# Patient Record
Sex: Female | Born: 1980 | Race: White | Hispanic: No | Marital: Married | Smoking: Never smoker
Health system: Southern US, Community
[De-identification: ages and names within clinical notes are randomized; demographics above are authoritative.]

## PROBLEM LIST (undated history)

## (undated) ENCOUNTER — Inpatient Hospital Stay (HOSPITAL_COMMUNITY): Payer: Self-pay

## (undated) DIAGNOSIS — Z789 Other specified health status: Secondary | ICD-10-CM

## (undated) DIAGNOSIS — R87629 Unspecified abnormal cytological findings in specimens from vagina: Secondary | ICD-10-CM

## (undated) HISTORY — PX: WISDOM TOOTH EXTRACTION: SHX21

## (undated) HISTORY — PX: NO PAST SURGERIES: SHX2092

---

## 2016-10-29 NOTE — L&D Delivery Note (Signed)
Patient is a 36 y.o. now [id number] s/p NSVD at 5660w6d, who was admitted for active labor with membranes intact.    Paulino RilyLiechty, BoyA Nettye [[id number]][[id number]]  Delivery Note At 5:11 PM a viable female (BABY Boy A) was delivered via  (Presentation: direct OA;  ).  APGAR: pending  , ; weight: pending  .   Placenta status: intact , 3VC.  Cord:  with the no complications: .  Cord pH: pending.   Delivered by Dr. Parke SimmersBland with direct supervision of Dr. Emelda FearFerguson and Hart RochesterLawson, CNM  Anesthesia:  epidural Episiotomy:  no Lacerations:  No lacerations requiring repair Suture Repair: N/A Est. Blood Loss (mL): 250ml   Baby Boy A, head delivered direct OA. No nuchal cord present. Shoulder and body delivered in usual fashion. Infant with spontaneous cry, placed on mother's abdomen, dried and bulb suctioned. Cord clamped x 2 after 1-minute delay, and cut by family member. Cord blood drawn.   Mom to postpartum.  Baby boy A to Couplet care / Skin to Skin,  Marthenia RollingScott Kalyssa Anker 09/15/2017, 5:34 PM     Earnest RosierLiechty, GirlB Tykeisha [[id number]][[id number]]  Delivery Note At 5:22 PM a viable female (BABY Girl B) was delivered via  (Presentation: direct OA;  ).  APGAR: pending  , ; weight: pending  .   Placenta status: intact , 3VC.  Cord:  with the no complications: .  Cord pH: pending.  Delivered by Dr. Parke SimmersBland with direct supervision by Dr. Emelda FearFerguson  Anesthesia:  epidural Episiotomy:  no Lacerations:  No lacerations requiring repair Suture Repair: N/A Est. Blood Loss (mL): 250ml   Baby girl B, head delivered direct OA app. 11 minutes s/p birth of Baby Boy A. No nuchal cord present. Shoulder and body delivered in usual fashion. Infant with spontaneous cry, placed on mother's abdomen, dried and bulb suctioned. Cord clamped x 2 after 1-minute delay, and cut by family member. Cord blood drawn. Placenta delivered spontaneously with gentle cord traction. Fundus firm with massage and Pitocin. Perineum inspected and found to have no lacerations requiring  repair. Mom to postpartum.  Baby Girl B to Couplet care / Skin to Skin.  600 cytotek PO given to prevent uterine atony.  Marthenia RollingScott Ceirra Belli 09/15/2017, 5:34 PM

## 2017-05-07 ENCOUNTER — Other Ambulatory Visit (HOSPITAL_COMMUNITY)
Admission: RE | Admit: 2017-05-07 | Discharge: 2017-05-07 | Disposition: A | Discharge status: Home / Self Care | Payer: Medicaid Other | Source: Ambulatory Visit | Attending: Certified Nurse Midwife | Admitting: Certified Nurse Midwife

## 2017-05-07 ENCOUNTER — Encounter: Payer: Self-pay | Admitting: Certified Nurse Midwife

## 2017-05-07 ENCOUNTER — Ambulatory Visit (INDEPENDENT_AMBULATORY_CARE_PROVIDER_SITE_OTHER): Payer: Medicaid Other | Admitting: Certified Nurse Midwife

## 2017-05-07 VITALS — BP 104/67 | HR 71 | Ht 65.0 in | Wt 181.0 lb

## 2017-05-07 DIAGNOSIS — Z3492 Encounter for supervision of normal pregnancy, unspecified, second trimester: Secondary | ICD-10-CM | POA: Insufficient documentation

## 2017-05-07 DIAGNOSIS — Z3480 Encounter for supervision of other normal pregnancy, unspecified trimester: Secondary | ICD-10-CM

## 2017-05-07 DIAGNOSIS — O0932 Supervision of pregnancy with insufficient antenatal care, second trimester: Secondary | ICD-10-CM | POA: Diagnosis not present

## 2017-05-07 DIAGNOSIS — O0992 Supervision of high risk pregnancy, unspecified, second trimester: Secondary | ICD-10-CM

## 2017-05-07 DIAGNOSIS — O0990 Supervision of high risk pregnancy, unspecified, unspecified trimester: Secondary | ICD-10-CM

## 2017-05-07 DIAGNOSIS — O0930 Supervision of pregnancy with insufficient antenatal care, unspecified trimester: Secondary | ICD-10-CM | POA: Insufficient documentation

## 2017-05-07 DIAGNOSIS — O30092 Twin pregnancy, unable to determine number of placenta and number of amniotic sacs, second trimester: Secondary | ICD-10-CM

## 2017-05-07 MED ORDER — OB COMPLETE PETITE 35-5-1-200 MG PO CAPS: 1.0000 | ORAL_CAPSULE | Freq: Every day | ORAL | 12 refills | Status: AC

## 2017-05-07 NOTE — Addendum Note (Signed)
Addended by: Orvilla CornwallENNEY, Arnika Larzelere A on: 05/07/2017 04:37 PM   Modules accepted: Orders

## 2017-05-07 NOTE — Progress Notes (Signed)
Subjective:    Andrea Alexander is being seen today for her first obstetrical visit.  This is a planned pregnancy. She is at [redacted]w[redacted]d gestation. Her obstetrical history is significant for grand multigravida all vaginal deliveries at term, no problems, no chronic health issues. Relationship with FOB: spouse, living together. Patient does intend to breast feed. Pregnancy history fully reviewed.  FOB has a hx of twins. 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  Does have her grandfather who is total care living with them.    The information documented in the HPI was reviewed and verified.  Menstrual History: OB History    Gravida Para Term Preterm AB Living   5 4 4     4    SAB TAB Ectopic Multiple Live Births           4       Patient's last menstrual period was 12/24/2016.    History reviewed. No pertinent past medical history.  History reviewed. No pertinent surgical history.   (Not in a hospital admission) Not on File  Social History  Substance Use Topics  . Smoking status: Never Smoker  . Smokeless tobacco: Never Used  . Alcohol use No    History reviewed. No pertinent family history.   Review of Systems Constitutional: negative for weight loss Gastrointestinal: negative for vomiting Genitourinary:negative for genital lesions and vaginal discharge and dysuria Musculoskeletal:negative for back pain Behavioral/Psych: negative for abusive relationship, depression, illegal drug usage and tobacco use    Objective:    BP 104/67   Pulse 71   Ht 5\' 5"  (1.651 m)   Wt 181 lb (82.1 kg)   LMP 12/24/2016   BMI 30.12 kg/m  General Appearance:    Alert, cooperative, no distress, appears stated age  Head:    Normocephalic, without obvious abnormality, atraumatic  Eyes:    PERRL, conjunctiva/corneas clear, EOM's intact, fundi    benign, both eyes  Ears:    Normal TM's and external ear canals, both ears  Nose:   Nares normal, septum midline, mucosa normal, no drainage    or sinus tenderness   Throat:   Lips, mucosa, and tongue normal; teeth and gums normal  Neck:   Supple, symmetrical, trachea midline, no adenopathy;    thyroid:  no enlargement/tenderness/nodules; no carotid   bruit or JVD  Back:     Symmetric, no curvature, ROM normal, no CVA tenderness  Lungs:     Clear to auscultation bilaterally, respirations unlabored  Chest Wall:    No tenderness or deformity   Heart:    Regular rate and rhythm, S1 and S2 normal, no murmur, rub   or gallop  Breast Exam:    No tenderness, masses, or nipple abnormality  Abdomen:     Soft, non-tender, bowel sounds active all four quadrants,    no masses, no organomegaly  Genitalia:    Normal female without lesion, discharge or tenderness  Extremities:   Extremities normal, atraumatic, no cyanosis or edema  Pulses:   2+ and symmetric all extremities  Skin:   Skin color, texture, turgor normal, no rashes or lesions  Lymph nodes:   Cervical, supraclavicular, and axillary nodes normal  Neurologic:   CNII-XII intact, normal strength, sensation and reflexes    throughout        Cervix: friable on exam, long, thick, closed and posterior.  FHR: by doppler 147;  154,  FH: 22 cm.    Limited US:  Dianionic fetuses noted with heart rates and movement.  Dividing membrane noted. Probable Di/Di twins.     Lab Review Urine pregnancy test Labs reviewed yes Radiologic studies reviewed no  Assessment & Plan    Pregnancy at 3646w1d weeks    1. Supervision of other normal pregnancy, antepartum     Twin gestation noted later in visit.  Dx changed in problem list.  - Varicella zoster antibody, IgG - Culture, OB Urine - Hemoglobin A1c - Obstetric Panel, Including HIV - Cystic Fibrosis Mutation 97 - Hemoglobinopathy evaluation - TSH Pregnancy - Cytology - PAP - Cervicovaginal ancillary only - US MFM OB COMP + 14 WK; Future - Prenat-FeCbn-FeAspGl-FA-Omega (OB COMPLETE PETITE) 35-5-1-200 MG CAPS; Take 1 tablet by mouth daily.   Dispense: 30 capsule; Refill: 12  2. Late prenatal care     @19 + weeks  3. Twin preg, unable to dtrm num plcnta & amnio sacs, 2nd tri      Probable Di/Di twin pregnancy - US MFM OB DETAIL +14 WK; Future - US MFM OB DETAIL ADDL GEST +14 WK; Future  4. Supervision of high risk pregnancy, antepartum           Prenatal vitamins.  Counseling provided regarding continued use of seat belts, cessation of alcohol consumption, smoking or use of illicit drugs; infection precautions i.e., influenza/TDAP immunizations, toxoplasmosis,CMV, parvovirus, listeria and varicella; workplace safety, exercise during pregnancy; routine dental care, safe medications, sexual activity, hot tubs, saunas, pools, travel, caffeine use, fish and methlymercury, potential toxins, hair treatments, varicose veins Weight gain recommendations per IOM guidelines reviewed: underweight/BMI< 18.5--> gain 28 - 40 lbs; normal weight/BMI 18.5 - 24.9--> gain 25 - 35 lbs; overweight/BMI 25 - 29.9--> gain 15 - 25 lbs; obese/BMI >30->gain  11 - 20 lbs Problem list reviewed and updated. FIRST/CF mutation testing/NIPT/QUAD SCREEN/fragile X/Ashkenazi Jewish population testing/Spinal muscular atrophy discussed: requested. Role of ultrasound in pregnancy discussed; fetal survey: ordered. Amniocentesis discussed: not indicated.  Meds ordered this encounter  Medications  . Prenat-FeCbn-FeAspGl-FA-Omega (OB COMPLETE PETITE) 35-5-1-200 MG CAPS    Sig: Take 1 tablet by mouth daily.    Dispense:  30 capsule    Refill:  12   Orders Placed This Encounter  Procedures  . Culture, OB Urine  . US MFM OB COMP + 14 WK    Standing Status:   Future    Standing Expiration Date:   07/08/2018    Order Specific Question:   Reason for Exam (SYMPTOM  OR DIAGNOSIS REQUIRED)    Answer:   fetal anatomy scan    Order Specific Question:   Preferred imaging location?    Answer:   [facility]  . US MFM OB DETAIL +14 WK    Standing Status:   Future     Standing Expiration Date:   07/08/2018    Order Specific Question:   Reason for Exam (SYMPTOM  OR DIAGNOSIS REQUIRED)    Answer:   twin pregnancy    Order Specific Question:   Preferred imaging location?    Answer:   [facility]  . US MFM OB DETAIL ADDL GEST +14 WK    Standing Status:   Future    Standing Expiration Date:   07/08/2018    Order Specific Question:   Reason for Exam (SYMPTOM  OR DIAGNOSIS REQUIRED)    Answer:   fetal anatomy scan, twin pregnancy, late to care    Order Specific Question:   Preferred  imaging location?    Answer:   [facility]  . Varicella zoster antibody, IgG  . Hemoglobin A1c  . Obstetric Panel, Including HIV  . Cystic Fibrosis Mutation 97  . Hemoglobinopathy evaluation  . TSH Pregnancy    Follow up in 4 weeks. 50% of 45 min visit spent on counseling and coordination of care.

## 2017-05-09 LAB — CERVICOVAGINAL ANCILLARY ONLY
Bacterial vaginitis: NEGATIVE
CANDIDA VAGINITIS: NEGATIVE
CHLAMYDIA, DNA PROBE: NEGATIVE
NEISSERIA GONORRHEA: NEGATIVE
Trichomonas: NEGATIVE

## 2017-05-10 ENCOUNTER — Other Ambulatory Visit: Payer: Self-pay | Admitting: Certified Nurse Midwife

## 2017-05-10 DIAGNOSIS — O0990 Supervision of high risk pregnancy, unspecified, unspecified trimester: Secondary | ICD-10-CM

## 2017-05-10 LAB — OBSTETRIC PANEL, INCLUDING HIV
Antibody Screen: NEGATIVE
BASOS ABS: 0 10*3/uL (ref 0.0–0.2)
Basos: 0 %
EOS (ABSOLUTE): 0.1 10*3/uL (ref 0.0–0.4)
Eos: 1 %
HIV SCREEN 4TH GENERATION: NONREACTIVE
Hematocrit: 36.4 % (ref 34.0–46.6)
Hemoglobin: 12.3 g/dL (ref 11.1–15.9)
Hepatitis B Surface Ag: NEGATIVE
IMMATURE GRANULOCYTES: 1 %
Immature Grans (Abs): 0.1 10*3/uL (ref 0.0–0.1)
LYMPHS ABS: 2 10*3/uL (ref 0.7–3.1)
Lymphs: 17 %
MCH: 30.6 pg (ref 26.6–33.0)
MCHC: 33.8 g/dL (ref 31.5–35.7)
MCV: 91 fL (ref 79–97)
MONOS ABS: 0.9 10*3/uL (ref 0.1–0.9)
Monocytes: 7 %
NEUTROS ABS: 8.6 10*3/uL — AB (ref 1.4–7.0)
NEUTROS PCT: 74 %
PLATELETS: 162 10*3/uL (ref 150–379)
RBC: 4.02 x10E6/uL (ref 3.77–5.28)
RDW: 13.5 % (ref 12.3–15.4)
RPR: NONREACTIVE
Rh Factor: POSITIVE
Rubella Antibodies, IGG: 1.37 index (ref >0.99)
WBC: 11.6 10*3/uL — AB (ref 3.4–10.8)

## 2017-05-10 LAB — CULTURE, OB URINE

## 2017-05-10 LAB — TSH PREGNANCY: TSH Pregnancy: 3.03 u[IU]/mL (ref 0.450–4.500)

## 2017-05-10 LAB — HEMOGLOBINOPATHY EVALUATION
HGB A: 97.5 % (ref 96.4–98.8)
HGB C: 0 %
HGB S: 0 %
HGB VARIANT: 0 %
Hemoglobin A2 Quantitation: 2.5 % (ref 1.8–3.2)
Hemoglobin F Quantitation: 0 % (ref 0.0–2.0)

## 2017-05-10 LAB — HEMOGLOBIN A1C
Est. average glucose Bld gHb Est-mCnc: 94 mg/dL
Hgb A1c MFr Bld: 4.9 % (ref 4.8–5.6)

## 2017-05-10 LAB — VARICELLA ZOSTER ANTIBODY, IGG: VARICELLA: 1523 {index} (ref >165)

## 2017-05-10 LAB — URINE CULTURE, OB REFLEX

## 2017-05-13 ENCOUNTER — Other Ambulatory Visit (HOSPITAL_COMMUNITY): Payer: Medicaid Other

## 2017-05-13 LAB — CYTOLOGY - PAP
Diagnosis: UNDETERMINED — AB
HPV (WINDOPATH): NOT DETECTED

## 2017-05-15 ENCOUNTER — Other Ambulatory Visit: Payer: Self-pay | Admitting: Certified Nurse Midwife

## 2017-05-15 DIAGNOSIS — O2342 Unspecified infection of urinary tract in pregnancy, second trimester: Secondary | ICD-10-CM | POA: Insufficient documentation

## 2017-05-15 DIAGNOSIS — O0990 Supervision of high risk pregnancy, unspecified, unspecified trimester: Secondary | ICD-10-CM

## 2017-05-15 MED ORDER — NITROFURANTOIN MONOHYD MACRO 100 MG PO CAPS: 100.0000 mg | ORAL_CAPSULE | Freq: Two times a day (BID) | ORAL | 0 refills | Status: DC

## 2017-05-16 ENCOUNTER — Encounter (HOSPITAL_COMMUNITY): Payer: Self-pay

## 2017-05-16 ENCOUNTER — Ambulatory Visit (HOSPITAL_COMMUNITY)
Admission: RE | Admit: 2017-05-16 | Discharge: 2017-05-16 | Disposition: A | Discharge status: Home / Self Care | Payer: Medicaid Other | Source: Ambulatory Visit | Attending: Certified Nurse Midwife | Admitting: Certified Nurse Midwife

## 2017-05-16 DIAGNOSIS — Z363 Encounter for antenatal screening for malformations: Secondary | ICD-10-CM | POA: Diagnosis not present

## 2017-05-16 DIAGNOSIS — O0990 Supervision of high risk pregnancy, unspecified, unspecified trimester: Secondary | ICD-10-CM

## 2017-05-16 DIAGNOSIS — O30042 Twin pregnancy, dichorionic/diamniotic, second trimester: Secondary | ICD-10-CM | POA: Insufficient documentation

## 2017-05-16 DIAGNOSIS — O30092 Twin pregnancy, unable to determine number of placenta and number of amniotic sacs, second trimester: Secondary | ICD-10-CM

## 2017-05-16 DIAGNOSIS — Z3A20 20 weeks gestation of pregnancy: Secondary | ICD-10-CM | POA: Diagnosis not present

## 2017-05-16 HISTORY — DX: Other specified health status: Z78.9

## 2017-05-17 ENCOUNTER — Other Ambulatory Visit (HOSPITAL_COMMUNITY): Payer: Medicaid Other

## 2017-05-17 ENCOUNTER — Other Ambulatory Visit (HOSPITAL_COMMUNITY): Payer: Self-pay | Admitting: *Deleted

## 2017-05-17 DIAGNOSIS — O30042 Twin pregnancy, dichorionic/diamniotic, second trimester: Secondary | ICD-10-CM

## 2017-05-20 ENCOUNTER — Other Ambulatory Visit: Payer: Self-pay | Admitting: Certified Nurse Midwife

## 2017-05-20 DIAGNOSIS — O30042 Twin pregnancy, dichorionic/diamniotic, second trimester: Secondary | ICD-10-CM

## 2017-05-20 DIAGNOSIS — O0990 Supervision of high risk pregnancy, unspecified, unspecified trimester: Secondary | ICD-10-CM

## 2017-05-20 DIAGNOSIS — O30049 Twin pregnancy, dichorionic/diamniotic, unspecified trimester: Secondary | ICD-10-CM | POA: Insufficient documentation

## 2017-05-21 ENCOUNTER — Other Ambulatory Visit: Payer: Self-pay | Admitting: Certified Nurse Midwife

## 2017-05-21 DIAGNOSIS — O0990 Supervision of high risk pregnancy, unspecified, unspecified trimester: Secondary | ICD-10-CM

## 2017-05-21 LAB — CYSTIC FIBROSIS MUTATION 97: GENE DIS ANAL CARRIER INTERP BLD/T-IMP: NOT DETECTED

## 2017-05-22 LAB — MATERNIT21 PLUS CORE+SCA

## 2017-06-04 ENCOUNTER — Ambulatory Visit (INDEPENDENT_AMBULATORY_CARE_PROVIDER_SITE_OTHER): Payer: Medicaid Other | Admitting: Certified Nurse Midwife

## 2017-06-04 ENCOUNTER — Encounter: Payer: Self-pay | Admitting: Certified Nurse Midwife

## 2017-06-04 VITALS — BP 116/71 | HR 84 | Wt 190.2 lb

## 2017-06-04 DIAGNOSIS — O30042 Twin pregnancy, dichorionic/diamniotic, second trimester: Secondary | ICD-10-CM

## 2017-06-04 DIAGNOSIS — O0990 Supervision of high risk pregnancy, unspecified, unspecified trimester: Secondary | ICD-10-CM

## 2017-06-04 DIAGNOSIS — O0932 Supervision of pregnancy with insufficient antenatal care, second trimester: Secondary | ICD-10-CM

## 2017-06-04 DIAGNOSIS — O2342 Unspecified infection of urinary tract in pregnancy, second trimester: Secondary | ICD-10-CM

## 2017-06-04 DIAGNOSIS — O0992 Supervision of high risk pregnancy, unspecified, second trimester: Secondary | ICD-10-CM

## 2017-06-04 DIAGNOSIS — O0930 Supervision of pregnancy with insufficient antenatal care, unspecified trimester: Secondary | ICD-10-CM

## 2017-06-04 MED ORDER — COMFORT FIT MATERNITY SUPP LG MISC: 1.0000 [IU] | Freq: Every day | 0 refills | Status: DC

## 2017-06-04 NOTE — Progress Notes (Signed)
   PRENATAL VISIT NOTE  Subjective:  Andrea Alexander is a 36 y.o. [id number] at 649w1d being seen today for ongoing prenatal care.  She is currently monitored for the following issues for this high-risk pregnancy and has Supervision of high risk pregnancy, antepartum; Late prenatal care; Urinary tract infection in mother during second trimester of pregnancy; and Twin gestation, dichorionic diamniotic on her problem list.  Patient reports backache, no bleeding, no contractions, no cramping and no leaking.  Contractions: Not present. Vag. Bleeding: None.  Movement: Present. Denies leaking of fluid.   The following portions of the patient's history were reviewed and updated as appropriate: allergies, current medications, past family history, past medical history, past social history, past surgical history and problem list. Problem list updated.  Objective:   Vitals:   06/04/17 1532  BP: 116/71  Pulse: 84  Weight: 190 lb 3.2 oz (86.3 kg)    Fetal Status: Fetal Heart Rate (bpm): 140;154 Fundal Height: 26 cm Movement: Present     General:  Alert, oriented and cooperative. Patient is in no acute distress.  Skin: Skin is warm and dry. No rash noted.   Cardiovascular: Normal heart rate noted  Respiratory: Normal respiratory effort, no problems with respiration noted  Abdomen: Soft, gravid, appropriate for gestational age.  Pain/Pressure: Present     Pelvic: Cervical exam deferred        Extremities: Normal range of motion.  Edema: None  Mental Status:  Normal mood and affect. Normal behavior. Normal judgment and thought content.   Assessment and Plan:  Pregnancy: G5P4004 at [redacted]w[redacted]d  1. Dichorionic diamniotic twin pregnancy in second trimester       - Elastic Bandages & Supports (COMFORT FIT MATERNITY SUPP LG) MISC; 1 Units by Does not apply route daily.  Dispense: 1 each; Refill: 0  2. Supervision of high risk pregnancy, antepartum      F/U anatomy US scheduled for 06/14/17  3. Late prenatal  care     @20  weeks  4. Urinary tract infection in mother during second trimester of pregnancy     TOC today.  - Urine Culture  Preterm labor symptoms and general obstetric precautions including but not limited to vaginal bleeding, contractions, leaking of fluid and fetal movement were reviewed in detail with the patient. Please refer to After Visit Summary for other counseling recommendations.  Return in about 4 weeks (around 07/02/2017) for 2 hr OGTT, HOB.   Roe Coombsachelle A Hassan Blackshire, CNM

## 2017-06-04 NOTE — Progress Notes (Signed)
Patient does feel pressure on/off. Patient does have some lower back pain.

## 2017-06-06 LAB — URINE CULTURE

## 2017-06-09 ENCOUNTER — Encounter (HOSPITAL_COMMUNITY): Payer: Self-pay

## 2017-06-09 ENCOUNTER — Inpatient Hospital Stay (HOSPITAL_COMMUNITY)
Admission: AD | Admit: 2017-06-09 | Discharge: 2017-06-09 | Disposition: A | Discharge status: Home / Self Care | Payer: Medicaid Other | Source: Ambulatory Visit | Attending: Obstetrics & Gynecology | Admitting: Obstetrics & Gynecology

## 2017-06-09 DIAGNOSIS — Z3A23 23 weeks gestation of pregnancy: Secondary | ICD-10-CM | POA: Diagnosis not present

## 2017-06-09 DIAGNOSIS — O30042 Twin pregnancy, dichorionic/diamniotic, second trimester: Secondary | ICD-10-CM | POA: Insufficient documentation

## 2017-06-09 DIAGNOSIS — Z3A25 25 weeks gestation of pregnancy: Secondary | ICD-10-CM | POA: Diagnosis not present

## 2017-06-09 DIAGNOSIS — O26852 Spotting complicating pregnancy, second trimester: Secondary | ICD-10-CM | POA: Diagnosis not present

## 2017-06-09 DIAGNOSIS — N898 Other specified noninflammatory disorders of vagina: Secondary | ICD-10-CM | POA: Insufficient documentation

## 2017-06-09 DIAGNOSIS — O0990 Supervision of high risk pregnancy, unspecified, unspecified trimester: Secondary | ICD-10-CM

## 2017-06-09 DIAGNOSIS — O26892 Other specified pregnancy related conditions, second trimester: Secondary | ICD-10-CM | POA: Insufficient documentation

## 2017-06-09 DIAGNOSIS — O4702 False labor before 37 completed weeks of gestation, second trimester: Secondary | ICD-10-CM | POA: Insufficient documentation

## 2017-06-09 DIAGNOSIS — O479 False labor, unspecified: Secondary | ICD-10-CM

## 2017-06-09 LAB — URINALYSIS, DIPSTICK ONLY
Bilirubin Urine: NEGATIVE
Glucose, UA: NEGATIVE mg/dL
Hgb urine dipstick: NEGATIVE
Ketones, ur: NEGATIVE mg/dL
NITRITE: NEGATIVE
Protein, ur: NEGATIVE mg/dL
SPECIFIC GRAVITY, URINE: 1.015 (ref 1.005–1.030)
pH: 6 (ref 5.0–8.0)

## 2017-06-09 LAB — WET PREP, GENITAL
Clue Cells Wet Prep HPF POC: NONE SEEN
Sperm: NONE SEEN
Trich, Wet Prep: NONE SEEN
Yeast Wet Prep HPF POC: NONE SEEN

## 2017-06-09 NOTE — Discharge Instructions (Signed)

## 2017-06-09 NOTE — MAU Provider Note (Signed)
History     CSN: [id number]  Arrival date and time: 06/09/17 2000   First Provider Initiated Contact with Patient 06/09/17 2052      Chief Complaint  Patient presents with  . Contractions  . Vaginal Bleeding   HPI  Ms. Andrea Alexander is a 36 y.o. [id number] at [redacted]w[redacted]d with DI/DI twins who presents to MAU today with complaint of spotting since last night and contractions today. She states she had mild cramping this morning and then noted 4 contractions, also mild, throughout the day today. She has noted spotting with wiping only since last night. She denies LOF. She denies recent intercourse or exam. She denies UTI symptoms. She has not yet filled her Rx for abdominal binder.   OB History    Gravida Para Term Preterm AB Living   5 4 4     4    SAB TAB Ectopic Multiple Live Births           4      Past Medical History:  Diagnosis Date  . Medical history non-contributory     Past Surgical History:  Procedure Laterality Date  . NO PAST SURGERIES      No family history on file.  Social History  Substance Use Topics  . Smoking status: Never Smoker  . Smokeless tobacco: Never Used  . Alcohol use No    Allergies: No Known Allergies  No prescriptions prior to admission.    Review of Systems  Constitutional: Negative for fever.  Gastrointestinal: Positive for abdominal pain. Negative for constipation, diarrhea, nausea and vomiting.  Genitourinary: Positive for vaginal bleeding. Negative for dysuria, frequency, urgency and vaginal discharge.   Physical Exam   Blood pressure 123/75, pulse 72, temperature 98.6 F (37 C), temperature source Oral, resp. rate 15, height 5\' 5"  (1.651 m), weight 192 lb 4 oz (87.2 kg), last menstrual period 12/24/2016.  Physical Exam  Nursing note and vitals reviewed. Constitutional: She is oriented to person, place, and time. She appears well-developed and well-nourished. No distress.  HENT:  Head: Normocephalic and atraumatic.   Cardiovascular: Normal rate.   Respiratory: Effort normal.  GI: Soft. She exhibits no distension. There is no tenderness.  Genitourinary: Uterus is enlarged. Uterus is not tender. Cervix exhibits no motion tenderness and no discharge. No bleeding in the vagina. Vaginal discharge (moderate, thin, white discharge noted) found.  Neurological: She is alert and oriented to person, place, and time.  Skin: Skin is warm and dry. No erythema.  Psychiatric: She has a normal mood and affect.  Dilation: Closed Effacement (%): Thick Cervical Position: Posterior Station: Ballotable Exam by:: Vonzella Nipple, PA-C   Results for orders placed or performed during the hospital encounter of 06/09/17 (from the past 24 hour(s))  Wet prep, genital     Status: Abnormal   Collection Time: 06/09/17  9:30 PM  Result Value Ref Range   Yeast Wet Prep HPF POC NONE SEEN NONE SEEN   Trich, Wet Prep NONE SEEN NONE SEEN   Clue Cells Wet Prep HPF POC NONE SEEN NONE SEEN   WBC, Wet Prep HPF POC MANY (A) NONE SEEN   Sperm NONE SEEN   Urinalysis, dipstick only     Status: Abnormal   Collection Time: 06/09/17 10:15 PM  Result Value Ref Range   Color, Urine YELLOW YELLOW   APPearance CLEAR CLEAR   Specific Gravity, Urine 1.015 1.005 - 1.030   pH 6.0 5.0 - 8.0   Glucose, UA NEGATIVE NEGATIVE mg/dL  Hgb urine dipstick NEGATIVE NEGATIVE   Bilirubin Urine NEGATIVE NEGATIVE   Ketones, ur NEGATIVE NEGATIVE mg/dL   Protein, ur NEGATIVE NEGATIVE mg/dL   Nitrite NEGATIVE NEGATIVE   Leukocytes, UA SMALL (A) NEGATIVE    Fetal Monitoring: Baseline A: 140 bpm Variability: moderate Accelerations: 10 x 10 Decelerations: none Baseline B: 150 bpm Variability: modereate Accelerations: 10 x 10 Decelerations: none  Contractions: none  MAU Course  Procedures None  MDM UA and wet prep today   Assessment and Plan  A: [name] Di twins at 5950w6d Braxton hicks contractions Vaginal discharge in pregnancy, second  trimester Spotting in pregnancy, second trimester   P: Discharge home Preterm labor precautions discussed Patient advised to follow-up with [facility] as planned for routine prenatal care or sooner if symptoms worsen Patient may return to MAU as needed or if her condition were to change or worsen   Vonzella NippleJulie Kalab Camps, PA-C 06/09/2017, 11:33 PM

## 2017-06-09 NOTE — Progress Notes (Addendum)
G5P4 @ 23.[redacted] wksga with DiDI twin IUP. Presents to triage for scant bleeding since yesterday. +FM felt. EFM applied.   2100: Provider at bs assessing pt.   2125: provider at bs for wetprep and SVE with speculum exam. No active bleeding noted.   2215: [facility]

## 2017-06-09 NOTE — MAU Note (Signed)
Patient presents to MAU with c/o spotting when she wiped x4, one last night then two times this morning and the last time right before she came in. States cramping and tightness has been on and off all day today. Denies recent intercourse. Denies LOF. +FM with twin B, complaints of decreased movement twin A.

## 2017-06-09 NOTE — MAU Note (Signed)
Patient did not have enough urine to send to lab but order was collected. Patient was informed to try again.

## 2017-06-14 ENCOUNTER — Encounter (HOSPITAL_COMMUNITY): Payer: Self-pay

## 2017-06-14 ENCOUNTER — Other Ambulatory Visit (HOSPITAL_COMMUNITY): Payer: Self-pay | Admitting: Maternal and Fetal Medicine

## 2017-06-14 ENCOUNTER — Ambulatory Visit (HOSPITAL_COMMUNITY)
Admission: RE | Admit: 2017-06-14 | Discharge: 2017-06-14 | Disposition: A | Discharge status: Home / Self Care | Payer: Medicaid Other | Source: Ambulatory Visit | Attending: Certified Nurse Midwife | Admitting: Certified Nurse Midwife

## 2017-06-14 DIAGNOSIS — O30042 Twin pregnancy, dichorionic/diamniotic, second trimester: Secondary | ICD-10-CM

## 2017-06-14 DIAGNOSIS — Z362 Encounter for other antenatal screening follow-up: Secondary | ICD-10-CM

## 2017-06-14 DIAGNOSIS — Z3A24 24 weeks gestation of pregnancy: Secondary | ICD-10-CM

## 2017-06-14 DIAGNOSIS — O321XX2 Maternal care for breech presentation, fetus 2: Secondary | ICD-10-CM | POA: Diagnosis not present

## 2017-06-17 ENCOUNTER — Other Ambulatory Visit (HOSPITAL_COMMUNITY): Payer: Self-pay | Admitting: *Deleted

## 2017-06-17 DIAGNOSIS — O30049 Twin pregnancy, dichorionic/diamniotic, unspecified trimester: Secondary | ICD-10-CM

## 2017-07-02 ENCOUNTER — Ambulatory Visit (INDEPENDENT_AMBULATORY_CARE_PROVIDER_SITE_OTHER): Payer: Medicaid Other | Admitting: Certified Nurse Midwife

## 2017-07-02 ENCOUNTER — Other Ambulatory Visit: Payer: Medicaid Other

## 2017-07-02 VITALS — BP 99/65 | HR 82 | Wt 197.5 lb

## 2017-07-02 DIAGNOSIS — O30042 Twin pregnancy, dichorionic/diamniotic, second trimester: Secondary | ICD-10-CM

## 2017-07-02 DIAGNOSIS — O0990 Supervision of high risk pregnancy, unspecified, unspecified trimester: Secondary | ICD-10-CM

## 2017-07-02 DIAGNOSIS — O0932 Supervision of pregnancy with insufficient antenatal care, second trimester: Secondary | ICD-10-CM

## 2017-07-02 DIAGNOSIS — O2242 Hemorrhoids in pregnancy, second trimester: Secondary | ICD-10-CM

## 2017-07-02 DIAGNOSIS — O0930 Supervision of pregnancy with insufficient antenatal care, unspecified trimester: Secondary | ICD-10-CM

## 2017-07-02 DIAGNOSIS — O0992 Supervision of high risk pregnancy, unspecified, second trimester: Secondary | ICD-10-CM

## 2017-07-02 MED ORDER — HYDROCORTISONE ACETATE 25 MG RE SUPP: 25.0000 mg | Freq: Two times a day (BID) | RECTAL | 4 refills | Status: DC

## 2017-07-02 NOTE — Patient Instructions (Addendum)
Places to have your son circumcised:    [facility] [phone] $480 by 4 wks  [facility] [phone] $244 by 4 wks  [facility] [phone] $175 by 2 wks  [facility] [phone] $220 by 4 wks [facility] [phone] $150 by 4 wks  These prices sometimes change but are roughly what you can expect to pay. Please call and confirm pricing.   Circumcision is considered an elective/non-medically necessary procedure. There are many reasons parents decide to have their sons circumsized. During the first year of life circumcised males have a reduced risk of urinary tract infections but after this year the rates between circumcised males and uncircumcised males are the same.  It is safe to have your son circumcised outside of the hospital and the places above perform them regularly.   AREA PEDIATRIC/FAMILY PRACTICE PHYSICIANS  [facility] [address] [place], [place]  [address] Phone - [phone]   Fax - [phone]  [facility] [address] [place], [place] [address] Phone - [phone]   Fax - [phone]  JACK AMOS [address] [place], [place]  [address] Phone - [phone]   Fax - [phone]  [facility] [address], [address] [place], [place]  [address] Phone - [phone]   Fax - [phone]  [facility] [address] [place], [place]  [address] Phone - [phone]   Fax - [phone]  [facility] [address], [address] [place], [place]  [address] Phone - [phone]   Fax - [phone]  [facility] [address] [place], [place]  [address] Phone - [phone]   Fax - [phone]  [facility] [address], [address] [place], [place]  [address] Phone - [phone]   Fax - [phone]  [facility] [address] [place], [place]  [address] Phone - [phone]   Fax - [phone] [facility] [address] [place], [place]  [address] Phone - [phone]   Fax - [phone]  [facility] [address] [place], [place]  [address] Phone - [phone]   Fax - [phone]  [facility] [address], [address] [place], [place]  [address] Phone - [phone]   Fax - [phone]  [facility] [address] [place], [place]  [address] Phone - [phone]   Fax - [phone]  [facility] [address], [address] [place], [place]  [address] Phone - [phone]  [facility] [address] [place], [place]  [address] Phone - [phone]   Fax - [phone]  [facility] [address], [address] [place], [place]  [address] Phone - [phone]   Fax - [phone]  [facility] [address] [place], [place]  [address] Phone - [phone]   Fax - [phone]  [facility] [address] [place], [place]  [address] Phone - [phone]   Fax - [phone]   [facility] [address] [place], [place]  [address] Phone - [phone]   Fax - [phone]  [facility] [address] [place], [place]  [address] Phone - [phone]   Fax - [phone]  DAVID RUBIN [address] [place], [place]  [address] Phone - [phone]   Fax - [phone]  [facility] [address] [place], [place]  [address] Phone - [phone]   Fax - [phone]  [facility] Alita Chyle [address] [place], [place]  [address] Phone - [phone]   Fax - [phone] Gerarda Fraction [address] [place], [place]  [address] Phone - [phone]   Fax - [phone]  [facility] [address] [place], [place]  [address] Phone - [phone]   Fax - [phone]  [facility] [place] [address] [place], [place]  [address] Phone - [phone]   Fax - [phone]  [facility] Wyvonne Lenz MD [address] [place] [address] Phone [phone]  Fax [phone]   Contraception Choices Contraception (birth control) is the use of any methods or devices to prevent pregnancy. Below are some methods to help avoid pregnancy. Hormonal methods  Contraceptive implant. This is a thin, plastic tube containing progesterone hormone. It does not contain estrogen hormone. Your health care provider inserts the tube in the inner part of the upper arm. The tube can remain in place for up to 3 years. After 3 years, the implant must be removed. The implant prevents the ovaries from releasing an egg (ovulation), thickens the cervical mucus to prevent sperm from entering the uterus, and thins the lining of the inside of the uterus.  Progesterone-only injections. These injections are given every 3 months by your health care provider to prevent pregnancy. This synthetic progesterone hormone stops the ovaries from releasing eggs. It also thickens cervical mucus and changes the uterine lining. This makes it harder for sperm to survive in the uterus.  Birth control pills. These pills contain estrogen and progesterone hormone. They work by preventing the ovaries from releasing eggs (ovulation). They also cause the cervical mucus to thicken, preventing the sperm from entering the uterus. Birth control pills are prescribed by a health care provider.Birth control pills can also be used to treat heavy periods.  Minipill. This type of birth control pill contains only the progesterone hormone. They are taken every day of  each month and must be prescribed by your health care provider.  Birth control patch. The patch contains hormones similar to those in birth control pills. It must be changed once a week and is prescribed by a health care provider.  Vaginal ring. The ring contains hormones similar to those in birth control pills. It is left in the vagina for 3 weeks, removed for 1 week, and then a new one is put back in place. The patient must be comfortable inserting and removing the ring from the vagina.A health care provider's prescription is necessary.  Emergency contraception. Emergency contraceptives prevent pregnancy after unprotected sexual intercourse. This pill can be taken right after sex or up to 5 days after unprotected sex. It is most effective the sooner you take the pills after having sexual intercourse. Most emergency contraceptive pills are available without a prescription. Check with your pharmacist. Do not use emergency contraception as your only form of birth control. Barrier methods  Female condom. This is a thin sheath (latex or rubber) that is worn over the penis during sexual intercourse. It can be used with spermicide to increase effectiveness.  Female condom. This is a soft, loose-fitting sheath that is put into the vagina before sexual intercourse.  Diaphragm. This is a soft, latex, dome-shaped barrier that must be fitted by a health care provider. It is inserted into the vagina, along with a spermicidal jelly. It is  inserted before intercourse. The diaphragm should be left in the vagina for 6 to 8 hours after intercourse.  Cervical cap. This is a round, soft, latex or plastic cup that fits over the cervix and must be fitted by a health care provider. The cap can be left in place for up to 48 hours after intercourse.  Sponge. This is a soft, circular piece of polyurethane foam. The sponge has spermicide in it. It is inserted into the vagina after wetting it and before sexual  intercourse.  Spermicides. These are chemicals that kill or block sperm from entering the cervix and uterus. They come in the form of creams, jellies, suppositories, foam, or tablets. They do not require a prescription. They are inserted into the vagina with an applicator before having sexual intercourse. The process must be repeated every time you have sexual intercourse. Intrauterine contraception  Intrauterine device (IUD). This is a T-shaped device that is put in a woman's uterus during a menstrual period to prevent pregnancy. There are 2 types: ? Copper IUD. This type of IUD is wrapped in copper wire and is placed inside the uterus. Copper makes the uterus and fallopian tubes produce a fluid that kills sperm. It can stay in place for 10 years. ? Hormone IUD. This type of IUD contains the hormone progestin (synthetic progesterone). The hormone thickens the cervical mucus and prevents sperm from entering the uterus, and it also thins the uterine lining to prevent implantation of a fertilized egg. The hormone can weaken or kill the sperm that get into the uterus. It can stay in place for 3-5 years, depending on which type of IUD is used. Permanent methods of contraception  Female tubal ligation. This is when the woman's fallopian tubes are surgically sealed, tied, or blocked to prevent the egg from traveling to the uterus.  Hysteroscopic sterilization. This involves placing a small coil or insert into each fallopian tube. Your doctor uses a technique called hysteroscopy to do the procedure. The device causes scar tissue to form. This results in permanent blockage of the fallopian tubes, so the sperm cannot fertilize the egg. It takes about 3 months after the procedure for the tubes to become blocked. You must use another form of birth control for these 3 months.  Female sterilization. This is when the female has the tubes that carry sperm tied off (vasectomy).This blocks sperm from entering the vagina  during sexual intercourse. After the procedure, the man can still ejaculate fluid (semen). Natural planning methods  Natural family planning. This is not having sexual intercourse or using a barrier method (condom, diaphragm, cervical cap) on days the woman could become pregnant.  Calendar method. This is keeping track of the length of each menstrual cycle and identifying when you are fertile.  Ovulation method. This is avoiding sexual intercourse during ovulation.  Symptothermal method. This is avoiding sexual intercourse during ovulation, using a thermometer and ovulation symptoms.  Post-ovulation method. This is timing sexual intercourse after you have ovulated. Regardless of which type or method of contraception you choose, it is important that you use condoms to protect against the transmission of sexually transmitted infections (STIs). Talk with your health care provider about which form of contraception is most appropriate for you. This information is not intended to replace advice given to you by your health care provider. Make sure you discuss any questions you have with your health care provider. Document Released: 10/15/2005 Document Revised: 03/22/2016 Document Reviewed: 04/09/2013 Elsevier Interactive Patient Education  2017  [employer].

## 2017-07-02 NOTE — Progress Notes (Signed)
Pt states she has had some spotting, thinks it may be rectal.

## 2017-07-02 NOTE — Progress Notes (Signed)
   PRENATAL VISIT NOTE  Subjective:  Andrea Alexander is a 36 y.o. [id number] at 9075w1d being seen today for ongoing prenatal care.  She is currently monitored for the following issues for this high-risk pregnancy and has Supervision of high risk pregnancy, antepartum; Late prenatal care; Urinary tract infection in mother during second trimester of pregnancy; and Twin gestation, dichorionic diamniotic on her problem list.  Patient reports no complaints.  Contractions: Not present. Vag. Bleeding: None.  Movement: Present. Denies leaking of fluid.   The following portions of the patient's history were reviewed and updated as appropriate: allergies, current medications, past family history, past medical history, past social history, past surgical history and problem list. Problem list updated.  Objective:   Vitals:   07/02/17 1010  BP: 99/65  Pulse: 82  Weight: 197 lb 8 oz (89.6 kg)    Fetal Status: Fetal Heart Rate (bpm): 150; 140   Movement: Present     General:  Alert, oriented and cooperative. Patient is in no acute distress.  Skin: Skin is warm and dry. No rash noted.   Cardiovascular: Normal heart rate noted  Respiratory: Normal respiratory effort, no problems with respiration noted  Abdomen: Soft, gravid, appropriate for gestational age.  Pain/Pressure: Present     Pelvic: Cervical exam deferred        Extremities: Normal range of motion.     Mental Status:  Normal mood and affect. Normal behavior. Normal judgment and thought content.   Assessment and Plan:  Pregnancy: G5P4004 at 3675w1d  1. Supervision of high risk pregnancy, antepartum     Antenatal testing @35  weeks.  Delivery @38  weeks planning. Reviewed MAU note from 06/09/17.  2. Dichorionic diamniotic twin pregnancy in second trimester     F/U US scheduled for 07/12/17.  On last US: discordant <4%.   3. Late prenatal care      @20  weeks.   Preterm labor symptoms and general obstetric precautions including but not limited to  vaginal bleeding, contractions, leaking of fluid and fetal movement were reviewed in detail with the patient. Please refer to After Visit Summary for other counseling recommendations.  Return in about 2 weeks (around 07/16/2017) [facility] Needs to see FP MD here.   Roe Coombsachelle A Posie Lillibridge, CNM

## 2017-07-03 LAB — GLUCOSE TOLERANCE, 2 HOURS W/ 1HR
GLUCOSE, 2 HOUR: 96 mg/dL (ref 65–152)
Glucose, 1 hour: 110 mg/dL (ref 65–179)
Glucose, Fasting: 76 mg/dL (ref 65–91)

## 2017-07-03 LAB — HIV ANTIBODY (ROUTINE TESTING W REFLEX): HIV Screen 4th Generation wRfx: NONREACTIVE

## 2017-07-03 LAB — CBC
HEMATOCRIT: 33.2 % — AB (ref 34.0–46.6)
Hemoglobin: 11.2 g/dL (ref 11.1–15.9)
MCH: 30.9 pg (ref 26.6–33.0)
MCHC: 33.7 g/dL (ref 31.5–35.7)
MCV: 92 fL (ref 79–97)
Platelets: 144 10*3/uL — ABNORMAL LOW (ref 150–379)
RBC: 3.62 x10E6/uL — ABNORMAL LOW (ref 3.77–5.28)
RDW: 13.2 % (ref 12.3–15.4)
WBC: 10.9 10*3/uL — AB (ref 3.4–10.8)

## 2017-07-03 LAB — RPR: RPR: NONREACTIVE

## 2017-07-10 ENCOUNTER — Other Ambulatory Visit: Payer: Self-pay | Admitting: Certified Nurse Midwife

## 2017-07-10 DIAGNOSIS — O0990 Supervision of high risk pregnancy, unspecified, unspecified trimester: Secondary | ICD-10-CM

## 2017-07-12 ENCOUNTER — Ambulatory Visit (HOSPITAL_COMMUNITY)
Admission: RE | Admit: 2017-07-12 | Discharge: 2017-07-12 | Disposition: A | Discharge status: Home / Self Care | Payer: Medicaid Other | Source: Ambulatory Visit | Attending: Certified Nurse Midwife | Admitting: Certified Nurse Midwife

## 2017-07-12 ENCOUNTER — Other Ambulatory Visit (HOSPITAL_COMMUNITY): Payer: Self-pay | Admitting: Obstetrics and Gynecology

## 2017-07-12 ENCOUNTER — Encounter (HOSPITAL_COMMUNITY): Payer: Self-pay

## 2017-07-12 DIAGNOSIS — O30049 Twin pregnancy, dichorionic/diamniotic, unspecified trimester: Secondary | ICD-10-CM

## 2017-07-12 DIAGNOSIS — Z3A28 28 weeks gestation of pregnancy: Secondary | ICD-10-CM | POA: Insufficient documentation

## 2017-07-12 DIAGNOSIS — O30043 Twin pregnancy, dichorionic/diamniotic, third trimester: Secondary | ICD-10-CM | POA: Insufficient documentation

## 2017-07-12 DIAGNOSIS — O0990 Supervision of high risk pregnancy, unspecified, unspecified trimester: Secondary | ICD-10-CM

## 2017-07-12 NOTE — Addendum Note (Signed)
Encounter addended by: Vanetta Shawl, RT, RVT, RDMS on: 07/12/2017  4:40 PM<BR>    Actions taken: Imaging Exam ended

## 2017-07-15 ENCOUNTER — Other Ambulatory Visit (HOSPITAL_COMMUNITY): Payer: Self-pay | Admitting: *Deleted

## 2017-07-15 DIAGNOSIS — O30043 Twin pregnancy, dichorionic/diamniotic, third trimester: Secondary | ICD-10-CM

## 2017-07-17 ENCOUNTER — Ambulatory Visit (INDEPENDENT_AMBULATORY_CARE_PROVIDER_SITE_OTHER): Payer: Medicaid Other | Admitting: Obstetrics & Gynecology

## 2017-07-17 VITALS — BP 113/71 | HR 74 | Wt 200.0 lb

## 2017-07-17 DIAGNOSIS — O30043 Twin pregnancy, dichorionic/diamniotic, third trimester: Secondary | ICD-10-CM

## 2017-07-17 DIAGNOSIS — O0990 Supervision of high risk pregnancy, unspecified, unspecified trimester: Secondary | ICD-10-CM

## 2017-07-17 NOTE — Progress Notes (Signed)
   PRENATAL VISIT NOTE  Subjective:  Andrea Alexander is a 35 y.o. Z6X0960 at [redacted]w[redacted]d being seen today for ongoing prenatal care.  She is currently monitored for the following issues for this high-risk pregnancy and has Supervision of high risk pregnancy, antepartum; Late prenatal care; Urinary tract infection in mother during second trimester of pregnancy; and Twin gestation, dichorionic diamniotic on her problem list.  Patient reports no complaints.  Contractions: Not present. Vag. Bleeding: None.  Movement: Present. Denies leaking of fluid.   The following portions of the patient's history were reviewed and updated as appropriate: allergies, current medications, past family history, past medical history, past social history, past surgical history and problem list. Problem list updated.  Objective:   Vitals:   07/17/17 1320  BP: 113/71  Pulse: 74  Weight: 200 lb (90.7 kg)    Fetal Status: Fetal Heart Rate (bpm): 146:138 Fundal Height: 34 cm Movement: Present     General:  Alert, oriented and cooperative. Patient is in no acute distress.  Skin: Skin is warm and dry. No rash noted.   Cardiovascular: Normal heart rate noted  Respiratory: Normal respiratory effort, no problems with respiration noted  Abdomen: Soft, gravid, appropriate for gestational age.  Pain/Pressure: Present     Pelvic: Cervical exam deferred        Extremities: Normal range of motion.  Edema: None  Mental Status:  Normal mood and affect. Normal behavior. Normal judgment and thought content.   Assessment and Plan:  Pregnancy: G5P4004 at [redacted]w[redacted]d  1. Supervision of high risk pregnancy, antepartum Doing well  2. Dichorionic diamniotic twin pregnancy in third trimester Concordant growth  Preterm labor symptoms and general obstetric precautions including but not limited to vaginal bleeding, contractions, leaking of fluid and fetal movement were reviewed in detail with the patient. Please refer to After Visit Summary  for other counseling recommendations.  Return in about 2 weeks (around 07/31/2017).   Scheryl Darter, MD

## 2017-07-17 NOTE — Patient Instructions (Signed)
Multiple Pregnancy Having a multiple pregnancy means that a woman is carrying more than one baby at a time. She may be pregnant with twins, triplets, or more. The majority of multiple pregnancies are twins. Naturally conceiving triplets or more (higher-order multiples) is rare. Multiple pregnancies are riskier than single pregnancies. A woman with a multiple pregnancy is more likely to have certain problems during her pregnancy. Therefore, she will need to have more frequent appointments for prenatal care. How does a multiple pregnancy happen? A multiple pregnancy happens when:  The woman's body releases more than one egg at a time, and then each egg gets fertilized by a different sperm. ? This is the most common type of multiple pregnancy. ? Twins or other multiples produced this way are fraternal. They are no more alike than non-multiple siblings are.  One sperm fertilizes one egg, which then divides into more than one embryo. ? Twins or other multiples produced this way are identical. Identical multiples are always the same gender, and they look very much alike.  Who is most likely to have a multiple pregnancy? A multiple pregnancy is more likely to develop in women who:  Have had fertility treatment, especially if the treatment included fertility drugs.  Are older than 34 years of age.  Have already had four or more children.  Have a family history of multiple pregnancy.  How is a multiple pregnancy diagnosed? A multiple pregnancy may be diagnosed based on:  Symptoms such as: ? Rapid weight gain in the first 3 months of pregnancy (first trimester). ? More severe nausea and breast tenderness than what is typical of a single pregnancy. ? The uterus measuring larger than what is normal for the stage of the pregnancy.  Blood tests that detect a higher-than-normal level of human chorionic gonadotropin (hCG). This is a hormone that your body produces in early pregnancy.  Ultrasound  exam. This is used to confirm that you are carrying multiples.  What risks are associated with multiple pregnancy? A multiple pregnancy puts you at a higher risk for certain problems during or after your pregnancy, including:  Having your babies delivered before you have reached a full-term pregnancy (preterm birth). A full-term pregnancy lasts for at least 37 weeks. Babies born before 53 weeks may have a higher risk of a variety of health problems, such as breathing problems, feeding difficulties, cerebral palsy, and learning disabilities.  Diabetes.  Preeclampsia. This is a serious condition that causes high blood pressure along with other symptoms, such as swelling and headaches, during pregnancy.  Excessive blood loss after childbirth (postpartum hemorrhage).  Postpartum depression.  Low birth weight of the babies.  How will having a multiple pregnancy affect my care? Your health care provider will want to monitor you more closely during your pregnancy to make sure that your babies are growing normally and that you are healthy. Follow these instructions at home: Because your pregnancy is considered to be high risk, you will need to work closely with your health care team. You may also need to make some lifestyle changes. These may include the following: Eating and drinking  Increase your nutrition. ? Follow your health care provider's recommendations for weight gain. You may need to gain a little extra weight when you are pregnant with multiples. ? Eat healthy snacks often throughout the day. This can add calories and reduce nausea.  Drink enough fluid to keep your urine clear or pale yellow.  Take prenatal vitamins. Activity By 20-24 weeks, you may  need to limit your activities.  Avoid activities and work that take a lot of effort (are strenuous).  Ask your health care provider when you should stop having sexual intercourse.  Rest often.  General instructions  Do not use  any products that contain nicotine or tobacco, such as cigarettes and e-cigarettes. If you need help quitting, ask your health care provider.  Do not drink alcohol or use illegal drugs.  Take over-the-counter and prescription medicines only as told by your health care provider.  Arrange for extra help around the house.  Keep all follow-up visits and all prenatal visits as told by your health care provider. This is important. Contact a health care provider if:  You have dizziness.  You have persistent nausea, vomiting, or diarrhea.  You are having trouble gaining weight.  You have feelings of depression or other emotions that are interfering with your normal activities. Get help right away if:  You have a fever.  You have pain with urination.  You have fluid leaking from your vagina.  You have a bad-smelling vaginal discharge.  You notice increased swelling in your face, hands, legs, or ankles.  You have spotting or bleeding from your vagina.  You have pelvic cramps, pelvic pressure, or nagging pain in your abdomen or lower back.  You are having regular contractions.  You develop a severe headache, with or without visual changes.  You have shortness of breath or chest pain.  You notice less fetal movement, or no fetal movement. This information is not intended to replace advice given to you by your health care provider. Make sure you discuss any questions you have with your health care provider. Document Released: 07/24/2008 Document Revised: 06/15/2016 Document Reviewed: 06/15/2016 Elsevier Interactive Patient Education  2018 [employer]  

## 2017-07-23 ENCOUNTER — Encounter: Payer: Self-pay | Admitting: *Deleted

## 2017-07-31 ENCOUNTER — Ambulatory Visit (INDEPENDENT_AMBULATORY_CARE_PROVIDER_SITE_OTHER): Payer: Medicaid Other | Admitting: Obstetrics and Gynecology

## 2017-07-31 VITALS — BP 113/65 | HR 73 | Wt 202.0 lb

## 2017-07-31 DIAGNOSIS — O30043 Twin pregnancy, dichorionic/diamniotic, third trimester: Secondary | ICD-10-CM

## 2017-07-31 DIAGNOSIS — O0990 Supervision of high risk pregnancy, unspecified, unspecified trimester: Secondary | ICD-10-CM

## 2017-07-31 DIAGNOSIS — O0993 Supervision of high risk pregnancy, unspecified, third trimester: Secondary | ICD-10-CM

## 2017-07-31 NOTE — Progress Notes (Signed)
Subjective:  Andrea Alexander is a 36 y.o. [id number] at [redacted]w[redacted]d being seen today for ongoing prenatal care.  She is currently monitored for the following issues for this high-risk pregnancy and has Supervision of high risk pregnancy, antepartum; Late prenatal care; and Twin gestation, dichorionic diamniotic on her problem list.  Patient reports no complaints.  Contractions: Not present. Vag. Bleeding: None.  Movement: Present. Denies leaking of fluid.   The following portions of the patient's history were reviewed and updated as appropriate: allergies, current medications, past family history, past medical history, past social history, past surgical history and problem list. Problem list updated.  Objective:   Vitals:   07/31/17 1427  BP: 113/65  Pulse: 73  Weight: 91.6 kg (202 lb)    Fetal Status: Fetal Heart Rate (bpm): 147:136   Movement: Present     General:  Alert, oriented and cooperative. Patient is in no acute distress.  Skin: Skin is warm and dry. No rash noted.   Cardiovascular: Normal heart rate noted  Respiratory: Normal respiratory effort, no problems with respiration noted  Abdomen: Soft, gravid, appropriate for gestational age. Pain/Pressure: Present     Pelvic:  Cervical exam deferred        Extremities: Normal range of motion.  Edema: Trace  Mental Status: Normal mood and affect. Normal behavior. Normal judgment and thought content.   Urinalysis:      Assessment and Plan:  Pregnancy: G5P4004 at [redacted]w[redacted]d  1. Dichorionic diamniotic twin pregnancy in third trimester Stable  Concordant growth Start antenatal testing at 35 weeks   2. Supervision of high risk pregnancy, antepartum Stable  Declined flu vaccine  Preterm labor symptoms and general obstetric precautions including but not limited to vaginal bleeding, contractions, leaking of fluid and fetal movement were reviewed in detail with the patient. Please refer to After Visit Summary for other counseling  recommendations.  Return in about 2 weeks (around 08/14/2017) for OB visit.   Hermina Staggers, MD

## 2017-08-09 ENCOUNTER — Ambulatory Visit (HOSPITAL_COMMUNITY)
Admission: RE | Admit: 2017-08-09 | Discharge: 2017-08-09 | Disposition: A | Discharge status: Home / Self Care | Payer: Medicaid Other | Source: Ambulatory Visit | Attending: Certified Nurse Midwife | Admitting: Certified Nurse Midwife

## 2017-08-09 ENCOUNTER — Encounter (HOSPITAL_COMMUNITY): Payer: Self-pay

## 2017-08-09 DIAGNOSIS — O30043 Twin pregnancy, dichorionic/diamniotic, third trimester: Secondary | ICD-10-CM | POA: Insufficient documentation

## 2017-08-09 DIAGNOSIS — Z3A32 32 weeks gestation of pregnancy: Secondary | ICD-10-CM | POA: Diagnosis not present

## 2017-08-09 DIAGNOSIS — O0933 Supervision of pregnancy with insufficient antenatal care, third trimester: Secondary | ICD-10-CM | POA: Diagnosis not present

## 2017-08-09 DIAGNOSIS — O0990 Supervision of high risk pregnancy, unspecified, unspecified trimester: Secondary | ICD-10-CM

## 2017-08-14 ENCOUNTER — Ambulatory Visit (INDEPENDENT_AMBULATORY_CARE_PROVIDER_SITE_OTHER): Payer: Medicaid Other | Admitting: Obstetrics and Gynecology

## 2017-08-14 ENCOUNTER — Other Ambulatory Visit: Payer: Self-pay | Admitting: Obstetrics and Gynecology

## 2017-08-14 VITALS — BP 116/66 | HR 71 | Wt 206.0 lb

## 2017-08-14 DIAGNOSIS — O0930 Supervision of pregnancy with insufficient antenatal care, unspecified trimester: Secondary | ICD-10-CM

## 2017-08-14 DIAGNOSIS — O0990 Supervision of high risk pregnancy, unspecified, unspecified trimester: Secondary | ICD-10-CM

## 2017-08-14 DIAGNOSIS — O30043 Twin pregnancy, dichorionic/diamniotic, third trimester: Secondary | ICD-10-CM

## 2017-08-14 DIAGNOSIS — O0993 Supervision of high risk pregnancy, unspecified, third trimester: Secondary | ICD-10-CM

## 2017-08-14 DIAGNOSIS — O0933 Supervision of pregnancy with insufficient antenatal care, third trimester: Secondary | ICD-10-CM

## 2017-08-14 NOTE — Progress Notes (Signed)
Pt reports increased vaginal discharge; denies itching and odor.

## 2017-08-14 NOTE — Progress Notes (Addendum)
PRENATAL VISIT NOTE  Subjective:  Andrea Alexander is a 36 y.o. [id number] at [redacted]w[redacted]d being seen today for ongoing prenatal care.  She is currently monitored for the following issues for this high-risk pregnancy and has Supervision of high risk pregnancy, antepartum; Late prenatal care; and Twin gestation, dichorionic diamniotic on her problem list.  Patient reports slightly increased discharge.  Contractions: Not present. Vag. Bleeding: None.  Movement: Present. Denies leaking of fluid.   The following portions of the patient's history were reviewed and updated as appropriate: allergies, current medications, past family history, past medical history, past social history, past surgical history and problem list. Problem list updated.  Objective:   Vitals:   08/14/17 1510  BP: 116/66  Pulse: 71  Weight: 206 lb (93.4 kg)    Fetal Status: Fetal Heart Rate (bpm): 147: 155   Movement: Present     General:  Alert, oriented and cooperative. Patient is in no acute distress.  Skin: Skin is warm and dry. No rash noted.   Cardiovascular: Normal heart rate noted  Respiratory: Normal respiratory effort, no problems with respiration noted  Abdomen: Soft, gravid, appropriate for gestational age.  Pain/Pressure: Present     Pelvic: Cervical exam deferred        Extremities: Normal range of motion.  Edema: Trace  Mental Status:  Normal mood and affect. Normal behavior. Normal judgment and thought content.   [place] Mfm Ob Follow Up  Result Date: 08/09/2017 ----------------------------------------------------------------------  OBSTETRICS REPORT                      (Signed Final 08/09/2017 05:55 pm) ---------------------------------------------------------------------- Patient Info  ID #:       [id number]                          D.O.B.:  Sep 26, 1981 (36 yrs)  Name:       Andrea Alexander                  Visit Date: 08/09/2017 02:57 pm ---------------------------------------------------------------------- Performed  By  Performed By:     Hurman Horn          Ref. Address:     [address]                                                             [place]                                                             [address]  Attending:  Particia Nearing MD       Location:         [facility]  Referred By:      Roe Coombs CNM ---------------------------------------------------------------------- Orders   #  Description                                 Code   1  [place] MFM OB FOLLOW UP                         [id number]   2  [place] MFM OB FOLLOW UP ADDL GEST               16109.60  ----------------------------------------------------------------------   #  Ordered By               Order #        Accession #    Episode #   1  MARK NEWMAN              [id number]      [id number]     [id number]   2  MARK NEWMAN              [id number]      [id number]     [id number]  ---------------------------------------------------------------------- Indications   [redacted] weeks gestation of pregnancy                Z3A.43   Twin pregnancy, di/di, third trimester         O30.043   Late to prenatal care, third trimester         O09.33  ---------------------------------------------------------------------- OB History  Blood Type:            Height:  5'5"   Weight (lb):  181       BMI:  30.12  Gravidity:    5         Term:   4        Prem:   0        SAB:   0  TOP:          0       Ectopic:  0        Living: 4 ---------------------------------------------------------------------- Fetal Evaluation (Fetus A)  Num Of Fetuses:     2  Fetal Heart         157  Rate(bpm):  Cardiac Activity:   Observed  Fetal Lie:          Maternal left side  Presentation:       Cephalic  Placenta:           Posterior, above cervical os  P. Cord Insertion:  Visualized, central  Amniotic Fluid  AFI FV:      Subjectively within normal limits  ---------------------------------------------------------------------- Biometry (Fetus A)  BPD:        83  mm     G. Age:  33w 3d         67  %    CI:        76.66   %    70 - 86  FL/HC:      20.6   %    19.9 - 21.5  HC:      300.3  mm     G. Age:  33w 2d         32  %    HC/AC:      1.09        0.96 - 1.11  AC:      276.6  mm     G. Age:  31w 5d         25  %    FL/BPD:     74.7   %    71 - 87  FL:         62  mm     G. Age:  32w 1d         26  %    FL/AC:      22.4   %    20 - 24  Est. FW:    1912  gm      4 lb 3 oz     49  %     FW Discordancy      0 \ 8 % ---------------------------------------------------------------------- Gestational Age (Fetus A)  LMP:           32w 4d        Date:  12/24/16                 EDD:   09/30/17  U/S Today:     32w 5d                                        EDD:   09/29/17  Best:          32w 4d     Det. By:  LMP  (12/24/16)          EDD:   09/30/17 ---------------------------------------------------------------------- Anatomy (Fetus A)  Cranium:               Appears normal         Aortic Arch:            Previously seen  Cavum:                 Appears normal         Ductal Arch:            Previously seen  Ventricles:            Appears normal         Diaphragm:              Appears normal  Choroid Plexus:        Previously seen        Stomach:                Appears normal, left                                                                        sided  Cerebellum:            Previously seen  Abdomen:                Appears normal  Posterior Fossa:       Previously seen        Abdominal Wall:         Previously seen  Nuchal Fold:           Previously seen        Cord Vessels:           Previously seen  Face:                  Orbits and profile     Kidneys:                Appear normal                         previously seen  Lips:                  Previously seen        Bladder:                Appears normal   Thoracic:              Appears normal         Spine:                  Previously seen  Heart:                 Appears normal         Upper Extremities:      Previously seen                         (4CH, axis, and situs  RVOT:                  Appears normal         Lower Extremities:      Previously seen  LVOT:                  Appears normal  Other:  Female gender previously seen. Heels previously visualized. ---------------------------------------------------------------------- Fetal Evaluation (Fetus B)  Num Of Fetuses:     2  Fetal Heart         137  Rate(bpm):  Cardiac Activity:   Observed  Fetal Lie:          Maternal right side  Presentation:       Cephalic  Placenta:           Anterior, above cervical os  P. Cord Insertion:  Visualized, central  Amniotic Fluid  AFI FV:      Subjectively within normal limits                              Largest Pocket(cm)                              2.82 ---------------------------------------------------------------------- Biometry (Fetus B)  BPD:      73.5  mm     G. Age:  29w 4d          0  %    CI:        66.66   %    70 - 86  FL/HC:      20.3   %    19.9 - 21.5  HC:      288.5  mm     G. Age:  31w 5d          5  %    HC/AC:      1.03        0.96 - 1.11  AC:      279.9  mm     G. Age:  32w 0d         34  %    FL/BPD:     79.9   %    71 - 87  FL:       58.7  mm     G. Age:  30w 5d          5  %    FL/AC:      21.0   %    20 - 24  Est. FW:    1753  gm    3 lb 14 oz      34  %     FW Discordancy         8  % ---------------------------------------------------------------------- Gestational Age (Fetus B)  LMP:           32w 4d        Date:  12/24/16                 EDD:   09/30/17  U/S Today:     31w 0d                                        EDD:   10/11/17  Best:          32w 4d     Det. By:  LMP  (12/24/16)          EDD:   09/30/17 ---------------------------------------------------------------------- Anatomy (Fetus B)   Cranium:               Appears normal         Aortic Arch:            Previously seen  Cavum:                 Appears normal         Ductal Arch:            Previously seen  Ventricles:            Appears normal         Diaphragm:              Appears normal  Choroid Plexus:        Previously seen        Stomach:                Appears normal, left                                                                        sided  Cerebellum:            Previously seen  Abdomen:                Appears normal  Posterior Fossa:       Previously seen        Abdominal Wall:         Previously seen  Nuchal Fold:           Previously seen        Cord Vessels:           Previously seen  Face:                  Orbits and profile     Kidneys:                Appear normal                         previously seen  Lips:                  Previously seen        Bladder:                Appears normal  Thoracic:              Appears normal         Spine:                  Previously seen  Heart:                 Appears normal         Upper Extremities:      Previously seen                         (4CH, axis, and situs  RVOT:                  Previously seen        Lower Extremities:      Previously seen  LVOT:                  Previously seen  Other:  Female gender previously seen. ---------------------------------------------------------------------- Cervix Uterus Adnexa  Cervix  Not visualized (advanced GA >29wks)  Uterus  No abnormality visualized.  Left Ovary  Not visualized.  Right Ovary  Not visualized.  Adnexa:       No abnormality visualized. ---------------------------------------------------------------------- Impression  Dichorionic/diamniotic twin pregnancy at 32+4 weeks  Cephalic/cephalic presentation  Normal interval anatomy x 2; anatomic survey complete x 2  Normal amniotic fluid volume x 2  Appropriate interval growths with EFWs at the 49th and 34th  %tiles  ---------------------------------------------------------------------- Recommendations  Follow-up ultrasound for growth in 4 weeks ----------------------------------------------------------------------                 Particia Nearing, MD Electronically Signed Final Report   08/09/2017 05:55 pm ----------------------------------------------------------------------  [place] Mfm Ob Follow Up Addl Gest  Result Date: 08/09/2017 ----------------------------------------------------------------------  OBSTETRICS REPORT                      (Signed Final 08/09/2017 05:55 pm) ---------------------------------------------------------------------- Patient Info  ID #:       [id number]                          D.O.B.:  [birth year] (36 yrs)  Name:       Andrea Alexander  Visit Date: 08/09/2017 02:57 pm ---------------------------------------------------------------------- Performed By  Performed By:     Hurman Horn          Ref. Address:     [address]                                                             [place]                                                             [address]  Attending:        Particia Nearing MD       Location:         [facility]  Referred By:      Roe Coombs CNM ---------------------------------------------------------------------- Orders   #  Description                                 Code   1  [place] MFM OB FOLLOW UP                         [id number]   2  [place] MFM OB FOLLOW UP ADDL GEST               [id number]  ----------------------------------------------------------------------   #  Ordered By               Order #        Accession #    Episode #   1  MARK NEWMAN              [id number]      [id number]     [id number]   2  MARK NEWMAN              [id number]      [id number]     [id number]  ----------------------------------------------------------------------  Indications   [redacted] weeks gestation of pregnancy                Z3A.1   Twin pregnancy, di/di, third trimester         O30.043   Late to prenatal care, third trimester         O09.33  ---------------------------------------------------------------------- OB History  Blood Type:            Height:  5'5"   Weight (lb):  181       BMI:  30.12  Gravidity:  5         Term:   4        Prem:   0        SAB:   0  TOP:          0       Ectopic:  0        Living: 4 ---------------------------------------------------------------------- Fetal Evaluation (Fetus A)  Num Of Fetuses:     2  Fetal Heart         157  Rate(bpm):  Cardiac Activity:   Observed  Fetal Lie:          Maternal left side  Presentation:       Cephalic  Placenta:           Posterior, above cervical os  P. Cord Insertion:  Visualized, central  Amniotic Fluid  AFI FV:      Subjectively within normal limits ---------------------------------------------------------------------- Biometry (Fetus A)  BPD:        83  mm     G. Age:  33w 3d         67  %    CI:        76.66   %    70 - 86                                                          FL/HC:      20.6   %    19.9 - 21.5  HC:      300.3  mm     G. Age:  33w 2d         32  %    HC/AC:      1.09        0.96 - 1.11  AC:      276.6  mm     G. Age:  31w 5d         25  %    FL/BPD:     74.7   %    71 - 87  FL:         62  mm     G. Age:  32w 1d         26  %    FL/AC:      22.4   %    20 - 24  Est. FW:    1912  gm      4 lb 3 oz     49  %     FW Discordancy      0 \ 8 % ---------------------------------------------------------------------- Gestational Age (Fetus A)  LMP:           32w 4d        Date:  12/24/16                 EDD:   09/30/17  U/S Today:     32w 5d                                        EDD:   09/29/17  Best:          32w 4d     Det.  By:  LMP  (12/24/16)          EDD:   09/30/17 ---------------------------------------------------------------------- Anatomy (Fetus A)  Cranium:               Appears  normal         Aortic Arch:            Previously seen  Cavum:                 Appears normal         Ductal Arch:            Previously seen  Ventricles:            Appears normal         Diaphragm:              Appears normal  Choroid Plexus:        Previously seen        Stomach:                Appears normal, left                                                                        sided  Cerebellum:            Previously seen        Abdomen:                Appears normal  Posterior Fossa:       Previously seen        Abdominal Wall:         Previously seen  Nuchal Fold:           Previously seen        Cord Vessels:           Previously seen  Face:                  Orbits and profile     Kidneys:                Appear normal                         previously seen  Lips:                  Previously seen        Bladder:                Appears normal  Thoracic:              Appears normal         Spine:                  Previously seen  Heart:                 Appears normal         Upper Extremities:      Previously seen                         (4CH, axis, and situs  RVOT:  Appears normal         Lower Extremities:      Previously seen  LVOT:                  Appears normal  Other:  Female gender previously seen. Heels previously visualized. ---------------------------------------------------------------------- Fetal Evaluation (Fetus B)  Num Of Fetuses:     2  Fetal Heart         137  Rate(bpm):  Cardiac Activity:   Observed  Fetal Lie:          Maternal right side  Presentation:       Cephalic  Placenta:           Anterior, above cervical os  P. Cord Insertion:  Visualized, central  Amniotic Fluid  AFI FV:      Subjectively within normal limits                              Largest Pocket(cm)                              2.82 ---------------------------------------------------------------------- Biometry (Fetus B)  BPD:      73.5  mm     G. Age:  29w 4d          0  %    CI:        66.66   %    70  - 86                                                          FL/HC:      20.3   %    19.9 - 21.5  HC:      288.5  mm     G. Age:  31w 5d          5  %    HC/AC:      1.03        0.96 - 1.11  AC:      279.9  mm     G. Age:  32w 0d         34  %    FL/BPD:     79.9   %    71 - 87  FL:       58.7  mm     G. Age:  30w 5d          5  %    FL/AC:      21.0   %    20 - 24  Est. FW:    1753  gm    3 lb 14 oz      34  %     FW Discordancy         8  % ---------------------------------------------------------------------- Gestational Age (Fetus B)  LMP:           32w 4d        Date:  12/24/16                 EDD:   09/30/17  U/S Today:     31w 0d  EDD:   10/11/17  Best:          Armida Sans32w 4d     Det. By:  LMP  (12/24/16)          EDD:   09/30/17 ---------------------------------------------------------------------- Anatomy (Fetus B)  Cranium:               Appears normal         Aortic Arch:            Previously seen  Cavum:                 Appears normal         Ductal Arch:            Previously seen  Ventricles:            Appears normal         Diaphragm:              Appears normal  Choroid Plexus:        Previously seen        Stomach:                Appears normal, left                                                                        sided  Cerebellum:            Previously seen        Abdomen:                Appears normal  Posterior Fossa:       Previously seen        Abdominal Wall:         Previously seen  Nuchal Fold:           Previously seen        Cord Vessels:           Previously seen  Face:                  Orbits and profile     Kidneys:                Appear normal                         previously seen  Lips:                  Previously seen        Bladder:                Appears normal  Thoracic:              Appears normal         Spine:                  Previously seen  Heart:                 Appears normal         Upper Extremities:      Previously seen                          (  4CH, axis, and situs  RVOT:                  Previously seen        Lower Extremities:      Previously seen  LVOT:                  Previously seen  Other:  Female gender previously seen. ---------------------------------------------------------------------- Cervix Uterus Adnexa  Cervix  Not visualized (advanced GA >29wks)  Uterus  No abnormality visualized.  Left Ovary  Not visualized.  Right Ovary  Not visualized.  Adnexa:       No abnormality visualized. ---------------------------------------------------------------------- Impression  Dichorionic/diamniotic twin pregnancy at 32+4 weeks  Cephalic/cephalic presentation  Normal interval anatomy x 2; anatomic survey complete x 2  Normal amniotic fluid volume x 2  Appropriate interval growths with EFWs at the 49th and 34th  %tiles ---------------------------------------------------------------------- Recommendations  Follow-up ultrasound for growth in 4 weeks ----------------------------------------------------------------------                 Particia Nearing, MD Electronically Signed Final Report   08/09/2017 05:55 pm ----------------------------------------------------------------------    Assessment and Plan:  Pregnancy: [id number] at [redacted]w[redacted]d  1. Supervision of high risk pregnancy, antepartum Tdap today  2. Dichorionic diamniotic twin pregnancy in third trimester Last growth appropriate, next MFM [place] 11/6 Start BPP weekly   3. Late prenatal care   Preterm labor symptoms and general obstetric precautions including but not limited to vaginal bleeding, contractions, leaking of fluid and fetal movement were reviewed in detail with the patient. Please refer to After Visit Summary for other counseling recommendations.  Return in about 1 week (around 08/21/2017) for OB visit, OB visit (MD).   Conan Bowens, MD

## 2017-08-14 NOTE — Patient Instructions (Signed)
Third Trimester of Pregnancy The third trimester is from week 28 through week 40 (months 7 through 9). The third trimester is a time when the unborn baby (fetus) is growing rapidly. At the end of the ninth month, the fetus is about 20 inches in length and weighs 6-10 pounds. Body changes during your third trimester Your body will continue to go through many changes during pregnancy. The changes vary from woman to woman. During the third trimester:  Your weight will continue to increase. You can expect to gain 25-35 pounds (11-16 kg) by the end of the pregnancy.  You may begin to get stretch marks on your hips, abdomen, and breasts.  You may urinate more often because the fetus is moving lower into your pelvis and pressing on your bladder.  You may develop or continue to have heartburn. This is caused by increased hormones that slow down muscles in the digestive tract.  You may develop or continue to have constipation because increased hormones slow digestion and cause the muscles that push waste through your intestines to relax.  You may develop hemorrhoids. These are swollen veins (varicose veins) in the rectum that can itch or be painful.  You may develop swollen, bulging veins (varicose veins) in your legs.  You may have increased body aches in the pelvis, back, or thighs. This is due to weight gain and increased hormones that are relaxing your joints.  You may have changes in your hair. These can include thickening of your hair, rapid growth, and changes in texture. Some women also have hair loss during or after pregnancy, or hair that feels dry or thin. Your hair will most likely return to normal after your baby is born.  Your breasts will continue to grow and they will continue to become tender. A yellow fluid (colostrum) may leak from your breasts. This is the first milk you are producing for your baby.  Your belly button may stick out.  You may notice more swelling in your hands,  face, or ankles.  You may have increased tingling or numbness in your hands, arms, and legs. The skin on your belly may also feel numb.  You may feel short of breath because of your expanding uterus.  You may have more problems sleeping. This can be caused by the size of your belly, increased need to urinate, and an increase in your body's metabolism.  You may notice the fetus "dropping," or moving lower in your abdomen (lightening).  You may have increased vaginal discharge.  You may notice your joints feel loose and you may have pain around your pelvic bone.  What to expect at prenatal visits You will have prenatal exams every 2 weeks until week 36. Then you will have weekly prenatal exams. During a routine prenatal visit:  You will be weighed to make sure you and the baby are growing normally.  Your blood pressure will be taken.  Your abdomen will be measured to track your baby's growth.  The fetal heartbeat will be listened to.  Any test results from the previous visit will be discussed.  You may have a cervical check near your due date to see if your cervix has softened or thinned (effaced).  You will be tested for Group B streptococcus. This happens between 35 and 37 weeks.  Your health care provider may ask you:  What your birth plan is.  How you are feeling.  If you are feeling the baby move.  If you have had   any abnormal symptoms, such as leaking fluid, bleeding, severe headaches, or abdominal cramping.  If you are using any tobacco products, including cigarettes, chewing tobacco, and electronic cigarettes.  If you have any questions.  Other tests or screenings that may be performed during your third trimester include:  Blood tests that check for low iron levels (anemia).  Fetal testing to check the health, activity level, and growth of the fetus. Testing is done if you have certain medical conditions or if there are problems during the  pregnancy.  Nonstress test (NST). This test checks the health of your baby to make sure there are no signs of problems, such as the baby not getting enough oxygen. During this test, a belt is placed around your belly. The baby is made to move, and its heart rate is monitored during movement.  What is false labor? False labor is a condition in which you feel small, irregular tightenings of the muscles in the womb (contractions) that usually go away with rest, changing position, or drinking water. These are called Braxton Hicks contractions. Contractions may last for hours, days, or even weeks before true labor sets in. If contractions come at regular intervals, become more frequent, increase in intensity, or become painful, you should see your health care provider. What are the signs of labor?  Abdominal cramps.  Regular contractions that start at 10 minutes apart and become stronger and more frequent with time.  Contractions that start on the top of the uterus and spread down to the lower abdomen and back.  Increased pelvic pressure and dull back pain.  A watery or bloody mucus discharge that comes from the vagina.  Leaking of amniotic fluid. This is also known as your "water breaking." It could be a slow trickle or a gush. Let your health care provider know if it has a color or strange odor. If you have any of these signs, call your health care provider right away, even if it is before your due date. Follow these instructions at home: Medicines  Follow your health care provider's instructions regarding medicine use. Specific medicines may be either safe or unsafe to take during pregnancy.  Take a prenatal vitamin that contains at least 600 micrograms (mcg) of folic acid.  If you develop constipation, try taking a stool softener if your health care provider approves. Eating and drinking  Eat a balanced diet that includes fresh fruits and vegetables, whole grains, good sources of protein  such as meat, eggs, or tofu, and low-fat dairy. Your health care provider will help you determine the amount of weight gain that is right for you.  Avoid raw meat and uncooked cheese. These carry germs that can cause birth defects in the baby.  If you have low calcium intake from food, talk to your health care provider about whether you should take a daily calcium supplement.  Eat four or five small meals rather than three large meals a day.  Limit foods that are high in fat and processed sugars, such as fried and sweet foods.  To prevent constipation: ? Drink enough fluid to keep your urine clear or pale yellow. ? Eat foods that are high in fiber, such as fresh fruits and vegetables, whole grains, and beans. Activity  Exercise only as directed by your health care provider. Most women can continue their usual exercise routine during pregnancy. Try to exercise for 30 minutes at least 5 days a week. Stop exercising if you experience uterine contractions.  Avoid heavy   lifting.  Do not exercise in extreme heat or humidity, or at high altitudes.  Wear low-heel, comfortable shoes.  Practice good posture.  You may continue to have sex unless your health care provider tells you otherwise. Relieving pain and discomfort  Take frequent breaks and rest with your legs elevated if you have leg cramps or low back pain.  Take warm sitz baths to soothe any pain or discomfort caused by hemorrhoids. Use hemorrhoid cream if your health care provider approves.  Wear a good support bra to prevent discomfort from breast tenderness.  If you develop varicose veins: ? Wear support pantyhose or compression stockings as told by your healthcare provider. ? Elevate your feet for 15 minutes, 3-4 times a day. Prenatal care  Write down your questions. Take them to your prenatal visits.  Keep all your prenatal visits as told by your health care provider. This is important. Safety  Wear your seat belt at  all times when driving.  Make a list of emergency phone numbers, including numbers for family, friends, the hospital, and police and fire departments. General instructions  Avoid cat litter boxes and soil used by cats. These carry germs that can cause birth defects in the baby. If you have a cat, ask someone to clean the litter box for you.  Do not travel far distances unless it is absolutely necessary and only with the approval of your health care provider.  Do not use hot tubs, steam rooms, or saunas.  Do not drink alcohol.  Do not use any products that contain nicotine or tobacco, such as cigarettes and e-cigarettes. If you need help quitting, ask your health care provider.  Do not use any medicinal herbs or unprescribed drugs. These chemicals affect the formation and growth of the baby.  Do not douche or use tampons or scented sanitary pads.  Do not cross your legs for long periods of time.  To prepare for the arrival of your baby: ? Take prenatal classes to understand, practice, and ask questions about labor and delivery. ? Make a trial run to the hospital. ? Visit the hospital and tour the maternity area. ? Arrange for maternity or paternity leave through employers. ? Arrange for family and friends to take care of pets while you are in the hospital. ? Purchase a rear-facing car seat and make sure you know how to install it in your car. ? Pack your hospital bag. ? Prepare the baby's nursery. Make sure to remove all pillows and stuffed animals from the baby's crib to prevent suffocation.  Visit your dentist if you have not gone during your pregnancy. Use a soft toothbrush to brush your teeth and be gentle when you floss. Contact a health care provider if:  You are unsure if you are in labor or if your water has broken.  You become dizzy.  You have mild pelvic cramps, pelvic pressure, or nagging pain in your abdominal area.  You have lower back pain.  You have persistent  nausea, vomiting, or diarrhea.  You have an unusual or bad smelling vaginal discharge.  You have pain when you urinate. Get help right away if:  Your water breaks before 37 weeks.  You have regular contractions less than 5 minutes apart before 37 weeks.  You have a fever.  You are leaking fluid from your vagina.  You have spotting or bleeding from your vagina.  You have severe abdominal pain or cramping.  You have rapid weight loss or weight gain.    You have shortness of breath with chest pain.  You notice sudden or extreme swelling of your face, hands, ankles, feet, or legs.  Your baby makes fewer than 10 movements in 2 hours.  You have severe headaches that do not go away when you take medicine.  You have vision changes. Summary  The third trimester is from week 28 through week 40, months 7 through 9. The third trimester is a time when the unborn baby (fetus) is growing rapidly.  During the third trimester, your discomfort may increase as you and your baby continue to gain weight. You may have abdominal, leg, and back pain, sleeping problems, and an increased need to urinate.  During the third trimester your breasts will keep growing and they will continue to become tender. A yellow fluid (colostrum) may leak from your breasts. This is the first milk you are producing for your baby.  False labor is a condition in which you feel small, irregular tightenings of the muscles in the womb (contractions) that eventually go away. These are called Braxton Hicks contractions. Contractions may last for hours, days, or even weeks before true labor sets in.  Signs of labor can include: abdominal cramps; regular contractions that start at 10 minutes apart and become stronger and more frequent with time; watery or bloody mucus discharge that comes from the vagina; increased pelvic pressure and dull back pain; and leaking of amniotic fluid. This information is not intended to replace advice  given to you by your health care provider. Make sure you discuss any questions you have with your health care provider. Document Released: 10/09/2001 Document Revised: 03/22/2016 Document Reviewed: 12/16/2012 Elsevier Interactive Patient Education  2017 [employer]  

## 2017-08-15 ENCOUNTER — Other Ambulatory Visit: Payer: Self-pay

## 2017-08-15 DIAGNOSIS — O30043 Twin pregnancy, dichorionic/diamniotic, third trimester: Secondary | ICD-10-CM

## 2017-08-16 ENCOUNTER — Other Ambulatory Visit (HOSPITAL_COMMUNITY): Payer: Self-pay | Admitting: *Deleted

## 2017-08-16 DIAGNOSIS — O30043 Twin pregnancy, dichorionic/diamniotic, third trimester: Secondary | ICD-10-CM

## 2017-08-20 ENCOUNTER — Ambulatory Visit (INDEPENDENT_AMBULATORY_CARE_PROVIDER_SITE_OTHER): Payer: Medicaid Other | Admitting: Obstetrics and Gynecology

## 2017-08-20 VITALS — BP 119/79 | HR 85 | Wt 208.0 lb

## 2017-08-20 DIAGNOSIS — O0990 Supervision of high risk pregnancy, unspecified, unspecified trimester: Secondary | ICD-10-CM

## 2017-08-20 DIAGNOSIS — O30043 Twin pregnancy, dichorionic/diamniotic, third trimester: Secondary | ICD-10-CM

## 2017-08-20 DIAGNOSIS — O0993 Supervision of high risk pregnancy, unspecified, third trimester: Secondary | ICD-10-CM

## 2017-08-20 NOTE — Progress Notes (Signed)
   PRENATAL VISIT NOTE  Subjective:  Andrea Alexander is a 36 y.o. [id number] at 1273w1d being seen today for ongoing prenatal care.  She is currently monitored for the following issues for this high-risk pregnancy and has Supervision of high risk pregnancy, antepartum; Late prenatal care; and Twin gestation, dichorionic diamniotic on her problem list.  Patient reports no complaints.  Contractions: Irregular. Vag. Bleeding: None.  Movement: Present. Denies leaking of fluid.   The following portions of the patient's history were reviewed and updated as appropriate: allergies, current medications, past family history, past medical history, past social history, past surgical history and problem list. Problem list updated.  Objective:   Vitals:   08/20/17 1046  BP: 119/79  Pulse: 85  Weight: 208 lb (94.3 kg)    Fetal Status: Fetal Heart Rate (bpm): 143:157   Movement: Present     General:  Alert, oriented and cooperative. Patient is in no acute distress.  Skin: Skin is warm and dry. No rash noted.   Cardiovascular: Normal heart rate noted  Respiratory: Normal respiratory effort, no problems with respiration noted  Abdomen: Soft, gravid, appropriate for gestational age.  Pain/Pressure: Present     Pelvic: Cervical exam deferred        Extremities: Normal range of motion.  Edema: Trace  Mental Status:  Normal mood and affect. Normal behavior. Normal judgment and thought content.   Assessment and Plan:  Pregnancy: G5P4004 at 873w1d  1. Supervision of high risk pregnancy, antepartum Patient is doing well without complaints Cultures next visit Patient is still looking for a pediatrician  2. Dichorionic diamniotic twin pregnancy in third trimester Follow up growth on 11/6 BPP on 10/26  Preterm labor symptoms and general obstetric precautions including but not limited to vaginal bleeding, contractions, leaking of fluid and fetal movement were reviewed in detail with the patient. Please refer  to After Visit Summary for other counseling recommendations.  Return for As scheduled.   Catalina AntiguaPeggy Tiyah Zelenak, MD

## 2017-08-23 ENCOUNTER — Other Ambulatory Visit: Payer: Self-pay | Admitting: Obstetrics and Gynecology

## 2017-08-23 ENCOUNTER — Ambulatory Visit (HOSPITAL_COMMUNITY)
Admission: RE | Admit: 2017-08-23 | Discharge: 2017-08-23 | Disposition: A | Discharge status: Home / Self Care | Payer: Medicaid Other | Source: Ambulatory Visit | Attending: Obstetrics and Gynecology | Admitting: Obstetrics and Gynecology

## 2017-08-23 DIAGNOSIS — Z3A34 34 weeks gestation of pregnancy: Secondary | ICD-10-CM | POA: Diagnosis not present

## 2017-08-23 DIAGNOSIS — O30043 Twin pregnancy, dichorionic/diamniotic, third trimester: Secondary | ICD-10-CM

## 2017-08-27 ENCOUNTER — Other Ambulatory Visit (HOSPITAL_COMMUNITY)
Admission: RE | Admit: 2017-08-27 | Discharge: 2017-08-27 | Disposition: A | Discharge status: Home / Self Care | Payer: Medicaid Other | Source: Ambulatory Visit | Attending: Obstetrics and Gynecology | Admitting: Obstetrics and Gynecology

## 2017-08-27 ENCOUNTER — Ambulatory Visit (INDEPENDENT_AMBULATORY_CARE_PROVIDER_SITE_OTHER): Payer: Medicaid Other | Admitting: Obstetrics and Gynecology

## 2017-08-27 VITALS — BP 106/70 | HR 84 | Wt 210.0 lb

## 2017-08-27 DIAGNOSIS — O0990 Supervision of high risk pregnancy, unspecified, unspecified trimester: Secondary | ICD-10-CM

## 2017-08-27 DIAGNOSIS — O30043 Twin pregnancy, dichorionic/diamniotic, third trimester: Secondary | ICD-10-CM | POA: Insufficient documentation

## 2017-08-27 NOTE — Progress Notes (Signed)
Pt states she is having increase in pelvic pressure and overall pain. Pt advised of GBS and cultures today.

## 2017-08-27 NOTE — Progress Notes (Signed)
Subjective:  Tedra SenegalLaurie Collignon is a 36 y.o. [id number] at 34 1d being seen today for ongoing prenatal care.  She is currently monitored for the following issues for this high-risk pregnancy and has Supervision of high risk pregnancy, antepartum; Late prenatal care; and Twin gestation, dichorionic diamniotic on her problem list.  Patient reports occasional contractions.  Contractions: Irregular. Vag. Bleeding: None.  Movement: Present. Denies leaking of fluid.   The following portions of the patient's history were reviewed and updated as appropriate: allergies, current medications, past family history, past medical history, past social history, past surgical history and problem list. Problem list updated.  Objective:   Vitals:   08/27/17 1321  BP: 106/70  Pulse: 84  Weight: 210 lb (95.3 kg)    Fetal Status: Fetal Heart Rate (bpm): 143:150   Movement: Present     General:  Alert, oriented and cooperative. Patient is in no acute distress.  Skin: Skin is warm and dry. No rash noted.   Cardiovascular: Normal heart rate noted  Respiratory: Normal respiratory effort, no problems with respiration noted  Abdomen: Soft, gravid, appropriate for gestational age. Pain/Pressure: Present     Pelvic:  Cervical exam deferred        Extremities: Normal range of motion.  Edema: Mild pitting, slight indentation  Mental Status: Normal mood and affect. Normal behavior. Normal judgment and thought content.   Urinalysis:      Assessment and Plan:  Pregnancy: G5P4004 at 34 1d  1. Supervision of high risk pregnancy, antepartum Stable GBS and vaginal cultures today  2. Dichorionic diamniotic twin pregnancy in third trimester Stable Continue with antenatal testing - Strep Gp B NAA - Cervicovaginal ancillary only  Preterm labor symptoms and general obstetric precautions including but not limited to vaginal bleeding, contractions, leaking of fluid and fetal movement were reviewed in detail with the  patient. Please refer to After Visit Summary for other counseling recommendations.  Return in about 1 week (around 09/03/2017) for OB visit.   Hermina StaggersErvin, Jacey Pelc L, MD

## 2017-08-29 LAB — CERVICOVAGINAL ANCILLARY ONLY
Chlamydia: NEGATIVE
NEISSERIA GONORRHEA: NEGATIVE

## 2017-08-29 LAB — STREP GP B NAA: STREP GROUP B AG: POSITIVE — AB

## 2017-08-30 ENCOUNTER — Ambulatory Visit (HOSPITAL_COMMUNITY)
Admission: RE | Admit: 2017-08-30 | Discharge: 2017-08-30 | Disposition: A | Discharge status: Home / Self Care | Payer: Medicaid Other | Source: Ambulatory Visit | Attending: Certified Nurse Midwife | Admitting: Certified Nurse Midwife

## 2017-08-30 ENCOUNTER — Other Ambulatory Visit: Payer: Self-pay | Admitting: Obstetrics and Gynecology

## 2017-08-30 ENCOUNTER — Other Ambulatory Visit (HOSPITAL_COMMUNITY): Payer: Medicaid Other

## 2017-08-30 DIAGNOSIS — O0933 Supervision of pregnancy with insufficient antenatal care, third trimester: Secondary | ICD-10-CM | POA: Insufficient documentation

## 2017-08-30 DIAGNOSIS — O30043 Twin pregnancy, dichorionic/diamniotic, third trimester: Secondary | ICD-10-CM | POA: Insufficient documentation

## 2017-08-30 DIAGNOSIS — Z3A35 35 weeks gestation of pregnancy: Secondary | ICD-10-CM | POA: Insufficient documentation

## 2017-09-03 ENCOUNTER — Encounter: Payer: Medicaid Other | Admitting: Obstetrics and Gynecology

## 2017-09-03 ENCOUNTER — Ambulatory Visit (INDEPENDENT_AMBULATORY_CARE_PROVIDER_SITE_OTHER): Payer: Medicaid Other | Admitting: Obstetrics and Gynecology

## 2017-09-03 ENCOUNTER — Encounter: Payer: Self-pay | Admitting: Obstetrics and Gynecology

## 2017-09-03 VITALS — BP 116/67 | HR 87 | Wt 215.0 lb

## 2017-09-03 DIAGNOSIS — O99820 Streptococcus B carrier state complicating pregnancy: Secondary | ICD-10-CM

## 2017-09-03 DIAGNOSIS — O0990 Supervision of high risk pregnancy, unspecified, unspecified trimester: Secondary | ICD-10-CM

## 2017-09-03 DIAGNOSIS — O30043 Twin pregnancy, dichorionic/diamniotic, third trimester: Secondary | ICD-10-CM

## 2017-09-03 NOTE — Patient Instructions (Signed)
Vaginal Delivery Vaginal delivery means that you will give birth by pushing your baby out of your birth canal (vagina). A team of health care providers will help you before, during, and after vaginal delivery. Birth experiences are unique for every woman and every pregnancy, and birth experiences vary depending on where you choose to give birth. What should I do to prepare for my baby's birth? Before your baby is born, it is important to talk with your health care provider about:  Your labor and delivery preferences. These may include: ? Medicines that you may be given. ? How you will manage your pain. This might include non-medical pain relief techniques or injectable pain relief such as epidural analgesia. ? How you and your baby will be monitored during labor and delivery. ? Who may be in the labor and delivery room with you. ? Your feelings about surgical delivery of your baby (cesarean delivery, or C-section) if this becomes necessary. ? Your feelings about receiving donated blood through an IV tube (blood transfusion) if this becomes necessary.  Whether you are able: ? To take pictures or videos of the birth. ? To eat during labor and delivery. ? To move around, walk, or change positions during labor and delivery.  What to expect after your baby is born, such as: ? Whether delayed umbilical cord clamping and cutting is offered. ? Who will care for your baby right after birth. ? Medicines or tests that may be recommended for your baby. ? Whether breastfeeding is supported in your hospital or birth center. ? How long you will be in the hospital or birth center.  How any medical conditions you have may affect your baby or your labor and delivery experience.  To prepare for your baby's birth, you should also:  Attend all of your health care visits before delivery (prenatal visits) as recommended by your health care provider. This is important.  Prepare your home for your baby's  arrival. Make sure that you have: ? Diapers. ? Baby clothing. ? Feeding equipment. ? Safe sleeping arrangements for you and your baby.  Install a car seat in your vehicle. Have your car seat checked by a certified car seat installer to make sure that it is installed safely.  Think about who will help you with your new baby at home for at least the first several weeks after delivery.  What can I expect when I arrive at the birth center or hospital? Once you are in labor and have been admitted into the hospital or birth center, your health care provider may:  Review your pregnancy history and any concerns you have.  Insert an IV tube into one of your veins. This is used to give you fluids and medicines.  Check your blood pressure, pulse, temperature, and heart rate (vital signs).  Check whether your bag of water (amniotic sac) has broken (ruptured).  Talk with you about your birth plan and discuss pain control options.  Monitoring Your health care provider may monitor your contractions (uterine monitoring) and your baby's heart rate (fetal monitoring). You may need to be monitored:  Often, but not continuously (intermittently).  All the time or for long periods at a time (continuously). Continuous monitoring may be needed if: ? You are taking certain medicines, such as medicine to relieve pain or make your contractions stronger. ? You have pregnancy or labor complications.  Monitoring may be done by:  Placing a special stethoscope or a handheld monitoring device on your abdomen to   check your baby's heartbeat, and feeling your abdomen for contractions. This method of monitoring does not continuously record your baby's heartbeat or your contractions.  Placing monitors on your abdomen (external monitors) to record your baby's heartbeat and the frequency and length of contractions. You may not have to wear external monitors all the time.  Placing monitors inside of your uterus  (internal monitors) to record your baby's heartbeat and the frequency, length, and strength of your contractions. ? Your health care provider may use internal monitors if he or she needs more information about the strength of your contractions or your baby's heart rate. ? Internal monitors are put in place by passing a thin, flexible wire through your vagina and into your uterus. Depending on the type of monitor, it may remain in your uterus or on your baby's head until birth. ? Your health care provider will discuss the benefits and risks of internal monitoring with you and will ask for your permission before inserting the monitors.  Telemetry. This is a type of continuous monitoring that can be done with external or internal monitors. Instead of having to stay in bed, you are able to move around during telemetry. Ask your health care provider if telemetry is an option for you.  Physical exam Your health care provider may perform a physical exam. This may include:  Checking whether your baby is positioned: ? With the head toward your vagina (head-down). This is most common. ? With the head toward the top of your uterus (head-up or breech). If your baby is in a breech position, your health care provider may try to turn your baby to a head-down position so you can deliver vaginally. If it does not seem that your baby can be born vaginally, your provider may recommend surgery to deliver your baby. In rare cases, you may be able to deliver vaginally if your baby is head-up (breech delivery). ? Lying sideways (transverse). Babies that are lying sideways cannot be delivered vaginally.  Checking your cervix to determine: ? Whether it is thinning out (effacing). ? Whether it is opening up (dilating). ? How low your baby has moved into your birth canal.  What are the three stages of labor and delivery?  Normal labor and delivery is divided into the following three stages: Stage 1  Stage 1 is the  longest stage of labor, and it can last for hours or days. Stage 1 includes: ? Early labor. This is when contractions may be irregular, or regular and mild. Generally, early labor contractions are more than 10 minutes apart. ? Active labor. This is when contractions get longer, more regular, more frequent, and more intense. ? The transition phase. This is when contractions happen very close together, are very intense, and may last longer than during any other part of labor.  Contractions generally feel mild, infrequent, and irregular at first. They get stronger, more frequent (about every 2-3 minutes), and more regular as you progress from early labor through active labor and transition.  Many women progress through stage 1 naturally, but you may need help to continue making progress. If this happens, your health care provider may talk with you about: ? Rupturing your amniotic sac if it has not ruptured yet. ? Giving you medicine to help make your contractions stronger and more frequent.  Stage 1 ends when your cervix is completely dilated to 4 inches (10 cm) and completely effaced. This happens at the end of the transition phase. Stage 2  Once   your cervix is completely effaced and dilated to 4 inches (10 cm), you may start to feel an urge to push. It is common for the body to naturally take a rest before feeling the urge to push, especially if you received an epidural or certain other pain medicines. This rest period may last for up to 1-2 hours, depending on your unique labor experience.  During stage 2, contractions are generally less painful, because pushing helps relieve contraction pain. Instead of contraction pain, you may feel stretching and burning pain, especially when the widest part of your baby's head passes through the vaginal opening (crowning).  Your health care provider will closely monitor your pushing progress and your baby's progress through the vagina during stage 2.  Your  health care provider may massage the area of skin between your vaginal opening and anus (perineum) or apply warm compresses to your perineum. This helps it stretch as the baby's head starts to crown, which can help prevent perineal tearing. ? In some cases, an incision may be made in your perineum (episiotomy) to allow the baby to pass through the vaginal opening. An episiotomy helps to make the opening of the vagina larger to allow more room for the baby to fit through.  It is very important to breathe and focus so your health care provider can control the delivery of your baby's head. Your health care provider may have you decrease the intensity of your pushing, to help prevent perineal tearing.  After delivery of your baby's head, the shoulders and the rest of the body generally deliver very quickly and without difficulty.  Once your baby is delivered, the umbilical cord may be cut right away, or this may be delayed for 1-2 minutes, depending on your baby's health. This may vary among health care providers, hospitals, and birth centers.  If you and your baby are healthy enough, your baby may be placed on your chest or abdomen to help maintain the baby's temperature and to help you bond with each other. Some mothers and babies start breastfeeding at this time. Your health care team will dry your baby and help keep your baby warm during this time.  Your baby may need immediate care if he or she: ? Showed signs of distress during labor. ? Has a medical condition. ? Was born too early (prematurely). ? Had a bowel movement before birth (meconium). ? Shows signs of difficulty transitioning from being inside the uterus to being outside of the uterus. If you are planning to breastfeed, your health care team will help you begin a feeding. Stage 3  The third stage of labor starts immediately after the birth of your baby and ends after you deliver the placenta. The placenta is an organ that develops  during pregnancy to provide oxygen and nutrients to your baby in the womb.  Delivering the placenta may require some pushing, and you may have mild contractions. Breastfeeding can stimulate contractions to help you deliver the placenta.  After the placenta is delivered, your uterus should tighten (contract) and become firm. This helps to stop bleeding in your uterus. To help your uterus contract and to control bleeding, your health care provider may: ? Give you medicine by injection, through an IV tube, by mouth, or through your rectum (rectally). ? Massage your abdomen or perform a vaginal exam to remove any blood clots that are left in your uterus. ? Empty your bladder by placing a thin, flexible tube (catheter) into your bladder. ? Encourage   you to breastfeed your baby. After labor is over, you and your baby will be monitored closely to ensure that you are both healthy until you are ready to go home. Your health care team will teach you how to care for yourself and your baby. This information is not intended to replace advice given to you by your health care provider. Make sure you discuss any questions you have with your health care provider. Document Released: 07/24/2008 Document Revised: 05/04/2016 Document Reviewed: 10/30/2015 Elsevier Interactive Patient Education  2018 [employer]  

## 2017-09-03 NOTE — Progress Notes (Signed)
Subjective:  Andrea Alexander is a 36 y.o. [id number] at 4224w1d being seen today for ongoing prenatal care.  She is currently monitored for the following issues for this high-risk pregnancy and has Supervision of high risk pregnancy, antepartum; Late prenatal care; Twin gestation, dichorionic diamniotic; and GBS (group B Streptococcus carrier), +RV culture, currently pregnant on their problem list.  Patient reports no complaints.  Contractions: Irregular. Vag. Bleeding: None.  Movement: Present. Denies leaking of fluid.   The following portions of the patient's history were reviewed and updated as appropriate: allergies, current medications, past family history, past medical history, past social history, past surgical history and problem list. Problem list updated.  Objective:   Vitals:   09/03/17 1613  BP: 116/67  Pulse: 87  Weight: 215 lb (97.5 kg)    Fetal Status: Fetal Heart Rate (bpm): 138/146   Movement: Present     General:  Alert, oriented and cooperative. Patient is in no acute distress.  Skin: Skin is warm and dry. No rash noted.   Cardiovascular: Normal heart rate noted  Respiratory: Normal respiratory effort, no problems with respiration noted  Abdomen: Soft, gravid, appropriate for gestational age. Pain/Pressure: Present     Pelvic:  Cervical exam performed        Extremities: Normal range of motion.  Edema: Moderate pitting, indentation subsides rapidly  Mental Status: Normal mood and affect. Normal behavior. Normal judgment and thought content.   Urinalysis:      Assessment and Plan:  Pregnancy: G5P4004 at 6524w1d  1. Supervision of high risk pregnancy, antepartum Stable Labor Precautions IOL scheduled at 38 weeks [employer] information provided  2. Dichorionic diamniotic twin pregnancy in third trimester Stable Continue with weekly antenatal testing  3. GBS (group B Streptococcus carrier), +RV culture, currently pregnant Tx while in labor  Term labor symptoms and  general obstetric precautions including but not limited to vaginal bleeding, contractions, leaking of fluid and fetal movement were reviewed in detail with the patient. Please refer to After Visit Summary for other counseling recommendations.  Return in about 1 week (around 09/10/2017) for OB visit.   Hermina StaggersErvin, Angelyna Henderson L, MD

## 2017-09-04 ENCOUNTER — Telehealth (HOSPITAL_COMMUNITY): Payer: Self-pay | Admitting: *Deleted

## 2017-09-04 ENCOUNTER — Encounter (HOSPITAL_COMMUNITY): Payer: Self-pay | Admitting: *Deleted

## 2017-09-04 NOTE — Telephone Encounter (Signed)
Preadmission screen  

## 2017-09-06 ENCOUNTER — Ambulatory Visit (HOSPITAL_COMMUNITY)
Admission: RE | Admit: 2017-09-06 | Discharge: 2017-09-06 | Disposition: A | Discharge status: Home / Self Care | Payer: Medicaid Other | Source: Ambulatory Visit | Attending: Certified Nurse Midwife | Admitting: Certified Nurse Midwife

## 2017-09-06 ENCOUNTER — Other Ambulatory Visit: Payer: Self-pay | Admitting: Obstetrics and Gynecology

## 2017-09-06 ENCOUNTER — Other Ambulatory Visit (HOSPITAL_COMMUNITY): Payer: Self-pay | Admitting: Maternal and Fetal Medicine

## 2017-09-06 ENCOUNTER — Encounter (HOSPITAL_COMMUNITY): Payer: Self-pay

## 2017-09-06 DIAGNOSIS — O30043 Twin pregnancy, dichorionic/diamniotic, third trimester: Secondary | ICD-10-CM

## 2017-09-06 DIAGNOSIS — O0990 Supervision of high risk pregnancy, unspecified, unspecified trimester: Secondary | ICD-10-CM

## 2017-09-06 DIAGNOSIS — Z3A35 35 weeks gestation of pregnancy: Secondary | ICD-10-CM

## 2017-09-06 DIAGNOSIS — Z3A36 36 weeks gestation of pregnancy: Secondary | ICD-10-CM | POA: Diagnosis not present

## 2017-09-06 DIAGNOSIS — O0933 Supervision of pregnancy with insufficient antenatal care, third trimester: Secondary | ICD-10-CM | POA: Diagnosis not present

## 2017-09-06 NOTE — Procedures (Signed)
Andrea SenegalLaurie Alexander 04/29/1981 5164w4d  Fetus A Non-Stress Test Interpretation for 09/06/17  Indication: Unsatisfactory BPP Andrea SenegalLaurie Purnell 11/04/1980 1764w4d   Fetus B Non-Stress Test Interpretation for 09/06/17  Indication: Unsatisfactory BPP  Fetal Heart Rate Fetus B Mode: External Baseline Rate (B): 130 BPM Variability: Moderate Accelerations: 15 x 15 Decelerations: None  Uterine Activity Mode: Toco, Palpation Contraction Frequency (min): x1 Contraction Duration (sec): 50  Contraction Quality: Mild(Pt denies feeling) Resting Tone Palpated: Relaxed       Fetal Heart Rate A Mode: External Baseline Rate (A): 125 bpm Variability: Moderate Accelerations: 15 x 15 Decelerations: None Multiple birth?: Yes  Uterine Activity Mode: Toco, Palpation Contraction Frequency (min): x1 Contraction Duration (sec): 50  Contraction Quality: Mild(Pt denies feeling) Resting Tone Palpated: Relaxed  Interpretation (Fetal Testing) Nonstress Test Interpretation: Reactive Overall Impression: Reassuring for gestational age Comments: Reviewed tracing with Dr. Clarisa FlingBrost

## 2017-09-09 ENCOUNTER — Inpatient Hospital Stay (HOSPITAL_COMMUNITY)
Admission: AD | Admit: 2017-09-09 | Discharge: 2017-09-09 | Disposition: A | Discharge status: Home / Self Care | Payer: Medicaid Other | Source: Ambulatory Visit | Attending: Family Medicine | Admitting: Family Medicine

## 2017-09-09 ENCOUNTER — Other Ambulatory Visit: Payer: Self-pay

## 2017-09-09 ENCOUNTER — Encounter (HOSPITAL_COMMUNITY): Payer: Self-pay | Admitting: *Deleted

## 2017-09-09 DIAGNOSIS — O368131 Decreased fetal movements, third trimester, fetus 1: Secondary | ICD-10-CM | POA: Diagnosis not present

## 2017-09-09 DIAGNOSIS — Z3A37 37 weeks gestation of pregnancy: Secondary | ICD-10-CM | POA: Diagnosis not present

## 2017-09-09 DIAGNOSIS — O30043 Twin pregnancy, dichorionic/diamniotic, third trimester: Secondary | ICD-10-CM | POA: Diagnosis not present

## 2017-09-09 DIAGNOSIS — O368130 Decreased fetal movements, third trimester, not applicable or unspecified: Secondary | ICD-10-CM | POA: Diagnosis present

## 2017-09-09 DIAGNOSIS — Z3689 Encounter for other specified antenatal screening: Secondary | ICD-10-CM

## 2017-09-09 NOTE — MAU Note (Signed)
Pt having twins, has noted decreased movement with Baby A this morning, felt slight movement on the way here, but not nearly as much as usual.  Baby B is moving.  Denies uc's, bleeding or LOF.

## 2017-09-09 NOTE — MAU Provider Note (Signed)
Chief Complaint:  Decreased Fetal Movement   First Provider Initiated Contact with Patient 09/09/17 1117      HPI: Andrea Alexander is a 36 y.o. [id number] at 5337w0dwho presents to maternity admissions reporting decreased movement of baby A since this morning. She reports Baby B was moving well but she did not feel movement of Baby B when she woke up today. Then, she felt 1-2 movements on the way to MAU but not as much as usual. After monitors were applied, she felt lots of movement and reports normal movement of both babies before NST was completed. She denies any other associated symptoms. She reports good fetal movement, denies LOF, vaginal bleeding, vaginal itching/burning, urinary symptoms, h/a, dizziness, n/v, or fever/chills.    HPI  Past Medical History: Past Medical History:  Diagnosis Date  . Medical history non-contributory     Past obstetric history: OB History  Gravida Para Term Preterm AB Living  5 4 4     4   SAB TAB Ectopic Multiple Live Births          4    # Outcome Date GA Lbr Len/2nd Weight Sex Delivery Anes PTL Lv  5 Current           4 Term 04/05/14 1062w0d   F Vag-Spont   LIV  3 Term 04/09/11 319w0d   M Vag-Spont   LIV  2 Term 02/28/07 8319w0d   F Vag-Spont   LIV  1 Term 04/24/05 3919w0d   M Vag-Spont   LIV      Past Surgical History: Past Surgical History:  Procedure Laterality Date  . WISDOM TOOTH EXTRACTION      Family History: History reviewed. No pertinent family history.  Social History: Social History   Tobacco Use  . Smoking status: Never Smoker  . Smokeless tobacco: Never Used  Substance Use Topics  . Alcohol use: No  . Drug use: No    Allergies: No Known Allergies  Meds:  Medications Prior to Admission  Medication Sig Dispense Refill Last Dose  . Omega-3 Fatty Acids (FISH OIL) 1000 MG CAPS Take 1 capsule daily by mouth.   09/09/2017 at Unknown time  . Prenat-FeCbn-FeAspGl-FA-Omega (OB COMPLETE PETITE) 35-5-1-200 MG CAPS Take 1 tablet by  mouth daily. 30 capsule 12 09/09/2017 at Unknown time  . Elastic Bandages & Supports (COMFORT FIT MATERNITY SUPP LG) MISC 1 Units by Does not apply route daily. (Patient not taking: Reported on 09/03/2017) 1 each 0 Not Taking  . hydrocortisone (ANUSOL-HC) 25 MG suppository Place 1 suppository (25 mg total) rectally 2 (two) times daily. (Patient not taking: Reported on 08/09/2017) 12 suppository 4 Not Taking    ROS:  Review of Systems  Constitutional: Negative for chills, fatigue and fever.  Eyes: Negative for visual disturbance.  Respiratory: Negative for shortness of breath.   Cardiovascular: Negative for chest pain.  Gastrointestinal: Negative for abdominal pain, nausea and vomiting.  Genitourinary: Negative for difficulty urinating, dysuria, flank pain, pelvic pain, vaginal bleeding, vaginal discharge and vaginal pain.  Neurological: Negative for dizziness and headaches.  Psychiatric/Behavioral: Negative.      I have reviewed patient's Past Medical Hx, Surgical Hx, Family Hx, Social Hx, medications and allergies.   Physical Exam   Patient Vitals for the past 24 hrs:  BP Temp Temp src Pulse Resp  09/09/17 1056 110/67 97.9 F (36.6 C) Oral 91 18   Constitutional: Well-developed, well-nourished female in no acute distress.  Cardiovascular: normal rate Respiratory: normal effort GI: Abd soft,  non-tender, gravid appropriate for gestational age.  MS: Extremities nontender, no edema, normal ROM Neurologic: Alert and oriented x 4.  GU: Neg CVAT.  PELVIC EXAM: Cervix pink, visually closed, without lesion, scant white creamy discharge, vaginal walls and external genitalia normal Bimanual exam: Cervix 0/long/high, firm, anterior, neg CMT, uterus nontender, nonenlarged, adnexa without tenderness, enlargement, or mass     FHT Baby A:  Baseline 135 , moderate variability, accelerations present, no decelerations FHT Baby B:  Baseline 145 , moderate variability, accelerations present, no  decelerations Contractions: None on toco or to palpation   Labs: No results found for this or any previous visit (from the past 24 hour(s)). O/Positive/-- (07/10 1142)  Imaging:    MAU Course/MDM: I have ordered labs and reviewed results.  NST reviewed and reactive x 2 Pt discharged with strict term labor precautions and fetal kick counts [facility] and [facility]OL scheduled in 1 week   Assessment: 1. Decreased fetal movements in third trimester, fetus 1 of multiple gestation   2. Dichorionic diamniotic twin pregnancy in third trimester   3. NST (non-stress test) reactive     Plan: Discharge home Labor precautions and fetal kick counts  Follow-up Information    [facility].   Why:  On 09/10/17 as scheduled. Return to MAU as needed for emergencies. Contact information: [address] [place] [phone]         Allergies as of 09/09/2017   No Known Allergies     Medication List    TAKE these medications   COMFORT FIT MATERNITY SUPP LG Misc 1 Units by Does not apply route daily.   Fish Oil 1000 MG Caps Take 1 capsule daily by mouth.   hydrocortisone 25 MG suppository Commonly known as:  ANUSOL-HC Place 1 suppository (25 mg total) rectally 2 (two) times daily.   OB COMPLETE PETITE 35-5-1-200 MG Caps Take 1 tablet by mouth daily.       Sharen CounterLisa Leftwich-Kirby Certified Nurse-Midwife 09/09/2017 11:27 AM

## 2017-09-10 ENCOUNTER — Ambulatory Visit (INDEPENDENT_AMBULATORY_CARE_PROVIDER_SITE_OTHER): Payer: Medicaid Other | Admitting: Obstetrics and Gynecology

## 2017-09-10 ENCOUNTER — Encounter: Payer: Medicaid Other | Admitting: Obstetrics and Gynecology

## 2017-09-10 ENCOUNTER — Encounter: Payer: Self-pay | Admitting: Obstetrics and Gynecology

## 2017-09-10 VITALS — BP 114/73 | HR 71 | Wt 220.0 lb

## 2017-09-10 DIAGNOSIS — O0990 Supervision of high risk pregnancy, unspecified, unspecified trimester: Secondary | ICD-10-CM

## 2017-09-10 DIAGNOSIS — O30043 Twin pregnancy, dichorionic/diamniotic, third trimester: Secondary | ICD-10-CM

## 2017-09-10 DIAGNOSIS — O99820 Streptococcus B carrier state complicating pregnancy: Secondary | ICD-10-CM

## 2017-09-10 NOTE — Patient Instructions (Signed)
Vaginal Delivery Vaginal delivery means that you will give birth by pushing your baby out of your birth canal (vagina). A team of health care providers will help you before, during, and after vaginal delivery. Birth experiences are unique for every woman and every pregnancy, and birth experiences vary depending on where you choose to give birth. What should I do to prepare for my baby's birth? Before your baby is born, it is important to talk with your health care provider about:  Your labor and delivery preferences. These may include: ? Medicines that you may be given. ? How you will manage your pain. This might include non-medical pain relief techniques or injectable pain relief such as epidural analgesia. ? How you and your baby will be monitored during labor and delivery. ? Who may be in the labor and delivery room with you. ? Your feelings about surgical delivery of your baby (cesarean delivery, or C-section) if this becomes necessary. ? Your feelings about receiving donated blood through an IV tube (blood transfusion) if this becomes necessary.  Whether you are able: ? To take pictures or videos of the birth. ? To eat during labor and delivery. ? To move around, walk, or change positions during labor and delivery.  What to expect after your baby is born, such as: ? Whether delayed umbilical cord clamping and cutting is offered. ? Who will care for your baby right after birth. ? Medicines or tests that may be recommended for your baby. ? Whether breastfeeding is supported in your hospital or birth center. ? How long you will be in the hospital or birth center.  How any medical conditions you have may affect your baby or your labor and delivery experience.  To prepare for your baby's birth, you should also:  Attend all of your health care visits before delivery (prenatal visits) as recommended by your health care provider. This is important.  Prepare your home for your baby's  arrival. Make sure that you have: ? Diapers. ? Baby clothing. ? Feeding equipment. ? Safe sleeping arrangements for you and your baby.  Install a car seat in your vehicle. Have your car seat checked by a certified car seat installer to make sure that it is installed safely.  Think about who will help you with your new baby at home for at least the first several weeks after delivery.  What can I expect when I arrive at the birth center or hospital? Once you are in labor and have been admitted into the hospital or birth center, your health care provider may:  Review your pregnancy history and any concerns you have.  Insert an IV tube into one of your veins. This is used to give you fluids and medicines.  Check your blood pressure, pulse, temperature, and heart rate (vital signs).  Check whether your bag of water (amniotic sac) has broken (ruptured).  Talk with you about your birth plan and discuss pain control options.  Monitoring Your health care provider may monitor your contractions (uterine monitoring) and your baby's heart rate (fetal monitoring). You may need to be monitored:  Often, but not continuously (intermittently).  All the time or for long periods at a time (continuously). Continuous monitoring may be needed if: ? You are taking certain medicines, such as medicine to relieve pain or make your contractions stronger. ? You have pregnancy or labor complications.  Monitoring may be done by:  Placing a special stethoscope or a handheld monitoring device on your abdomen to   check your baby's heartbeat, and feeling your abdomen for contractions. This method of monitoring does not continuously record your baby's heartbeat or your contractions.  Placing monitors on your abdomen (external monitors) to record your baby's heartbeat and the frequency and length of contractions. You may not have to wear external monitors all the time.  Placing monitors inside of your uterus  (internal monitors) to record your baby's heartbeat and the frequency, length, and strength of your contractions. ? Your health care provider may use internal monitors if he or she needs more information about the strength of your contractions or your baby's heart rate. ? Internal monitors are put in place by passing a thin, flexible wire through your vagina and into your uterus. Depending on the type of monitor, it may remain in your uterus or on your baby's head until birth. ? Your health care provider will discuss the benefits and risks of internal monitoring with you and will ask for your permission before inserting the monitors.  Telemetry. This is a type of continuous monitoring that can be done with external or internal monitors. Instead of having to stay in bed, you are able to move around during telemetry. Ask your health care provider if telemetry is an option for you.  Physical exam Your health care provider may perform a physical exam. This may include:  Checking whether your baby is positioned: ? With the head toward your vagina (head-down). This is most common. ? With the head toward the top of your uterus (head-up or breech). If your baby is in a breech position, your health care provider may try to turn your baby to a head-down position so you can deliver vaginally. If it does not seem that your baby can be born vaginally, your provider may recommend surgery to deliver your baby. In rare cases, you may be able to deliver vaginally if your baby is head-up (breech delivery). ? Lying sideways (transverse). Babies that are lying sideways cannot be delivered vaginally.  Checking your cervix to determine: ? Whether it is thinning out (effacing). ? Whether it is opening up (dilating). ? How low your baby has moved into your birth canal.  What are the three stages of labor and delivery?  Normal labor and delivery is divided into the following three stages: Stage 1  Stage 1 is the  longest stage of labor, and it can last for hours or days. Stage 1 includes: ? Early labor. This is when contractions may be irregular, or regular and mild. Generally, early labor contractions are more than 10 minutes apart. ? Active labor. This is when contractions get longer, more regular, more frequent, and more intense. ? The transition phase. This is when contractions happen very close together, are very intense, and may last longer than during any other part of labor.  Contractions generally feel mild, infrequent, and irregular at first. They get stronger, more frequent (about every 2-3 minutes), and more regular as you progress from early labor through active labor and transition.  Many women progress through stage 1 naturally, but you may need help to continue making progress. If this happens, your health care provider may talk with you about: ? Rupturing your amniotic sac if it has not ruptured yet. ? Giving you medicine to help make your contractions stronger and more frequent.  Stage 1 ends when your cervix is completely dilated to 4 inches (10 cm) and completely effaced. This happens at the end of the transition phase. Stage 2  Once   your cervix is completely effaced and dilated to 4 inches (10 cm), you may start to feel an urge to push. It is common for the body to naturally take a rest before feeling the urge to push, especially if you received an epidural or certain other pain medicines. This rest period may last for up to 1-2 hours, depending on your unique labor experience.  During stage 2, contractions are generally less painful, because pushing helps relieve contraction pain. Instead of contraction pain, you may feel stretching and burning pain, especially when the widest part of your baby's head passes through the vaginal opening (crowning).  Your health care provider will closely monitor your pushing progress and your baby's progress through the vagina during stage 2.  Your  health care provider may massage the area of skin between your vaginal opening and anus (perineum) or apply warm compresses to your perineum. This helps it stretch as the baby's head starts to crown, which can help prevent perineal tearing. ? In some cases, an incision may be made in your perineum (episiotomy) to allow the baby to pass through the vaginal opening. An episiotomy helps to make the opening of the vagina larger to allow more room for the baby to fit through.  It is very important to breathe and focus so your health care provider can control the delivery of your baby's head. Your health care provider may have you decrease the intensity of your pushing, to help prevent perineal tearing.  After delivery of your baby's head, the shoulders and the rest of the body generally deliver very quickly and without difficulty.  Once your baby is delivered, the umbilical cord may be cut right away, or this may be delayed for 1-2 minutes, depending on your baby's health. This may vary among health care providers, hospitals, and birth centers.  If you and your baby are healthy enough, your baby may be placed on your chest or abdomen to help maintain the baby's temperature and to help you bond with each other. Some mothers and babies start breastfeeding at this time. Your health care team will dry your baby and help keep your baby warm during this time.  Your baby may need immediate care if he or she: ? Showed signs of distress during labor. ? Has a medical condition. ? Was born too early (prematurely). ? Had a bowel movement before birth (meconium). ? Shows signs of difficulty transitioning from being inside the uterus to being outside of the uterus. If you are planning to breastfeed, your health care team will help you begin a feeding. Stage 3  The third stage of labor starts immediately after the birth of your baby and ends after you deliver the placenta. The placenta is an organ that develops  during pregnancy to provide oxygen and nutrients to your baby in the womb.  Delivering the placenta may require some pushing, and you may have mild contractions. Breastfeeding can stimulate contractions to help you deliver the placenta.  After the placenta is delivered, your uterus should tighten (contract) and become firm. This helps to stop bleeding in your uterus. To help your uterus contract and to control bleeding, your health care provider may: ? Give you medicine by injection, through an IV tube, by mouth, or through your rectum (rectally). ? Massage your abdomen or perform a vaginal exam to remove any blood clots that are left in your uterus. ? Empty your bladder by placing a thin, flexible tube (catheter) into your bladder. ? Encourage   you to breastfeed your baby. After labor is over, you and your baby will be monitored closely to ensure that you are both healthy until you are ready to go home. Your health care team will teach you how to care for yourself and your baby. This information is not intended to replace advice given to you by your health care provider. Make sure you discuss any questions you have with your health care provider. Document Released: 07/24/2008 Document Revised: 05/04/2016 Document Reviewed: 10/30/2015 Elsevier Interactive Patient Education  2018 [employer]  

## 2017-09-10 NOTE — Progress Notes (Signed)
Subjective:  Andrea Alexander is a 36 y.o. [id number] at 7266w1d being seen today for ongoing prenatal care.  She is currently monitored for the following issues for this high-risk pregnancy and has Supervision of high risk pregnancy, antepartum; Late prenatal care; Twin gestation, dichorionic diamniotic; and GBS (group B Streptococcus carrier), +RV culture, currently pregnant on their problem list.  Patient reports no complaints.  Contractions: Irregular. Vag. Bleeding: None.  Movement: Present. Denies leaking of fluid.   The following portions of the patient's history were reviewed and updated as appropriate: allergies, current medications, past family history, past medical history, past social history, past surgical history and problem list. Problem list updated.  Objective:   Vitals:   09/10/17 1331  BP: 114/73  Pulse: 71  Weight: 220 lb (99.8 kg)    Fetal Status: Fetal Heart Rate (bpm): 137/152   Movement: Present     General:  Alert, oriented and cooperative. Patient is in no acute distress.  Skin: Skin is warm and dry. No rash noted.   Cardiovascular: Normal heart rate noted  Respiratory: Normal respiratory effort, no problems with respiration noted  Abdomen: Soft, gravid, appropriate for gestational age. Pain/Pressure: Present     Pelvic:  Cervical exam deferred        Extremities: Normal range of motion.  Edema: Moderate pitting, indentation subsides rapidly  Mental Status: Normal mood and affect. Normal behavior. Normal judgment and thought content.   Urinalysis:      Assessment and Plan:  Pregnancy: G5P4004 at 1466w1d  1. Supervision of high risk pregnancy, antepartum Stable Labor precautions  IOL Monday  2. Dichorionic diamniotic twin pregnancy in third trimester Stable U/S friday  3. GBS (group B Streptococcus carrier), +RV culture, currently pregnant Tx while in labor  Term labor symptoms and general obstetric precautions including but not limited to vaginal bleeding,  contractions, leaking of fluid and fetal movement were reviewed in detail with the patient. Please refer to After Visit Summary for other counseling recommendations.  Return in about 4 weeks (around 10/08/2017).   Hermina StaggersErvin, Imir Brumbach L, MD

## 2017-09-13 ENCOUNTER — Other Ambulatory Visit: Payer: Self-pay | Admitting: Obstetrics and Gynecology

## 2017-09-13 ENCOUNTER — Other Ambulatory Visit (HOSPITAL_COMMUNITY): Payer: Medicaid Other

## 2017-09-13 ENCOUNTER — Ambulatory Visit (HOSPITAL_COMMUNITY)
Admission: RE | Admit: 2017-09-13 | Discharge: 2017-09-13 | Disposition: A | Discharge status: Home / Self Care | Payer: Medicaid Other | Source: Ambulatory Visit | Attending: Certified Nurse Midwife | Admitting: Certified Nurse Midwife

## 2017-09-13 ENCOUNTER — Encounter (HOSPITAL_COMMUNITY): Payer: Self-pay

## 2017-09-13 DIAGNOSIS — O30043 Twin pregnancy, dichorionic/diamniotic, third trimester: Secondary | ICD-10-CM | POA: Diagnosis not present

## 2017-09-13 DIAGNOSIS — Z3A37 37 weeks gestation of pregnancy: Secondary | ICD-10-CM | POA: Diagnosis not present

## 2017-09-15 ENCOUNTER — Inpatient Hospital Stay (HOSPITAL_COMMUNITY): Payer: Medicaid Other | Admitting: Anesthesiology

## 2017-09-15 ENCOUNTER — Encounter (HOSPITAL_COMMUNITY): Payer: Self-pay | Admitting: *Deleted

## 2017-09-15 ENCOUNTER — Inpatient Hospital Stay (HOSPITAL_COMMUNITY)
Admission: AD | Admit: 2017-09-15 | Discharge: 2017-09-17 | DRG: 807 | Disposition: A | Discharge status: Home / Self Care | Payer: Medicaid Other | Source: Ambulatory Visit | Attending: Obstetrics and Gynecology | Admitting: Obstetrics and Gynecology

## 2017-09-15 ENCOUNTER — Other Ambulatory Visit: Payer: Self-pay

## 2017-09-15 DIAGNOSIS — O30043 Twin pregnancy, dichorionic/diamniotic, third trimester: Principal | ICD-10-CM | POA: Diagnosis present

## 2017-09-15 DIAGNOSIS — O30003 Twin pregnancy, unspecified number of placenta and unspecified number of amniotic sacs, third trimester: Secondary | ICD-10-CM | POA: Diagnosis present

## 2017-09-15 DIAGNOSIS — Z3A37 37 weeks gestation of pregnancy: Secondary | ICD-10-CM | POA: Diagnosis not present

## 2017-09-15 DIAGNOSIS — O99824 Streptococcus B carrier state complicating childbirth: Secondary | ICD-10-CM | POA: Diagnosis present

## 2017-09-15 DIAGNOSIS — O0990 Supervision of high risk pregnancy, unspecified, unspecified trimester: Secondary | ICD-10-CM

## 2017-09-15 HISTORY — DX: Unspecified abnormal cytological findings in specimens from vagina: R87.629

## 2017-09-15 LAB — CBC
HEMATOCRIT: 39.2 % (ref 36.0–46.0)
HEMATOCRIT: 36.6 % (ref 36.0–46.0)
HEMOGLOBIN: 12.9 g/dL (ref 12.0–15.0)
Hemoglobin: 11.9 g/dL — ABNORMAL LOW (ref 12.0–15.0)
MCH: 29.8 pg (ref 26.0–34.0)
MCH: 29.5 pg (ref 26.0–34.0)
MCHC: 32.9 g/dL (ref 30.0–36.0)
MCHC: 32.5 g/dL (ref 30.0–36.0)
MCV: 90.5 fL (ref 78.0–100.0)
MCV: 90.6 fL (ref 78.0–100.0)
Platelets: 145 10*3/uL — ABNORMAL LOW (ref 150–400)
Platelets: 127 10*3/uL — ABNORMAL LOW (ref 150–400)
RBC: 4.33 MIL/uL (ref 3.87–5.11)
RBC: 4.04 MIL/uL (ref 3.87–5.11)
RDW: 13.2 % (ref 11.5–15.5)
RDW: 13.1 % (ref 11.5–15.5)
WBC: 11.2 10*3/uL — AB (ref 4.0–10.5)
WBC: 10.7 10*3/uL — ABNORMAL HIGH (ref 4.0–10.5)

## 2017-09-15 LAB — TYPE AND SCREEN
ABO/RH(D): O POS
ANTIBODY SCREEN: NEGATIVE

## 2017-09-15 LAB — ABO/RH: ABO/RH(D): O POS

## 2017-09-15 MED ORDER — SIMETHICONE 80 MG PO CHEW: 80.0000 mg | CHEWABLE_TABLET | ORAL | Status: DC | PRN

## 2017-09-15 MED ORDER — SOD CITRATE-CITRIC ACID 500-334 MG/5ML PO SOLN: 30.0000 mL | ORAL | Status: DC | PRN

## 2017-09-15 MED ORDER — SODIUM CHLORIDE 0.9 % IV SOLN
1.0000 g | Freq: Once | INTRAVENOUS | Status: AC
  Administered 2017-09-15: 1 g via INTRAVENOUS
  Filled 2017-09-15: qty 1000

## 2017-09-15 MED ORDER — OXYTOCIN 40 UNITS IN LACTATED RINGERS INFUSION - SIMPLE MED
1.0000 m[IU]/min | INTRAVENOUS | Status: DC
  Administered 2017-09-15: 2 m[IU]/min via INTRAVENOUS
  Filled 2017-09-15: qty 1000

## 2017-09-15 MED ORDER — PHENYLEPHRINE 40 MCG/ML (10ML) SYRINGE FOR IV PUSH (FOR BLOOD PRESSURE SUPPORT)
80.0000 ug | PREFILLED_SYRINGE | INTRAVENOUS | Status: DC | PRN
  Filled 2017-09-15: qty 5
  Filled 2017-09-15: qty 10

## 2017-09-15 MED ORDER — DIPHENHYDRAMINE HCL 25 MG PO CAPS: 25.0000 mg | ORAL_CAPSULE | Freq: Four times a day (QID) | ORAL | Status: DC | PRN

## 2017-09-15 MED ORDER — PHENYLEPHRINE 40 MCG/ML (10ML) SYRINGE FOR IV PUSH (FOR BLOOD PRESSURE SUPPORT)
80.0000 ug | PREFILLED_SYRINGE | INTRAVENOUS | Status: DC | PRN
  Filled 2017-09-15: qty 5

## 2017-09-15 MED ORDER — EPHEDRINE 5 MG/ML INJ
10.0000 mg | INTRAVENOUS | Status: DC | PRN
  Filled 2017-09-15: qty 2

## 2017-09-15 MED ORDER — PENICILLIN G POTASSIUM 5000000 UNITS IJ SOLR
5.0000 10*6.[IU] | Freq: Once | INTRAVENOUS | Status: DC
  Filled 2017-09-15: qty 5

## 2017-09-15 MED ORDER — DIPHENHYDRAMINE HCL 50 MG/ML IJ SOLN: 12.5000 mg | INTRAMUSCULAR | Status: DC | PRN

## 2017-09-15 MED ORDER — OXYCODONE-ACETAMINOPHEN 5-325 MG PO TABS: 1.0000 | ORAL_TABLET | ORAL | Status: DC | PRN

## 2017-09-15 MED ORDER — TETANUS-DIPHTH-ACELL PERTUSSIS 5-2.5-18.5 LF-MCG/0.5 IM SUSP
0.5000 mL | Freq: Once | INTRAMUSCULAR | Status: AC
  Administered 2017-09-16: 0.5 mL via INTRAMUSCULAR
  Filled 2017-09-15: qty 0.5

## 2017-09-15 MED ORDER — COCONUT OIL OIL: 1.0000 "application " | TOPICAL_OIL | Status: DC | PRN

## 2017-09-15 MED ORDER — MISOPROSTOL 25 MCG QUARTER TABLET
25.0000 ug | ORAL_TABLET | ORAL | Status: DC | PRN
  Filled 2017-09-15: qty 1

## 2017-09-15 MED ORDER — SODIUM CHLORIDE 0.9 % IV SOLN
2.0000 g | Freq: Once | INTRAVENOUS | Status: AC
  Administered 2017-09-15: 2 g via INTRAVENOUS
  Filled 2017-09-15: qty 2000

## 2017-09-15 MED ORDER — LACTATED RINGERS IV SOLN
500.0000 mL | Freq: Once | INTRAVENOUS | Status: AC
  Administered 2017-09-15: 500 mL via INTRAVENOUS

## 2017-09-15 MED ORDER — LIDOCAINE HCL (PF) 1 % IJ SOLN
30.0000 mL | INTRAMUSCULAR | Status: DC | PRN
  Filled 2017-09-15: qty 30

## 2017-09-15 MED ORDER — DIBUCAINE 1 % RE OINT: 1.0000 "application " | TOPICAL_OINTMENT | RECTAL | Status: DC | PRN

## 2017-09-15 MED ORDER — ACETAMINOPHEN 325 MG PO TABS: 650.0000 mg | ORAL_TABLET | ORAL | Status: DC | PRN

## 2017-09-15 MED ORDER — ONDANSETRON HCL 4 MG/2ML IJ SOLN: 4.0000 mg | Freq: Four times a day (QID) | INTRAMUSCULAR | Status: DC | PRN

## 2017-09-15 MED ORDER — ONDANSETRON HCL 4 MG PO TABS
4.0000 mg | ORAL_TABLET | ORAL | Status: DC | PRN
Start: 2017-09-15 — End: 2017-09-17

## 2017-09-15 MED ORDER — PRENATAL MULTIVITAMIN CH
1.0000 | ORAL_TABLET | Freq: Every day | ORAL | Status: DC
  Administered 2017-09-16 – 2017-09-17 (×2): 1 via ORAL
  Filled 2017-09-15 (×2): qty 1

## 2017-09-15 MED ORDER — PENICILLIN G POT IN DEXTROSE 60000 UNIT/ML IV SOLN: 3.0000 10*6.[IU] | INTRAVENOUS | Status: DC

## 2017-09-15 MED ORDER — LACTATED RINGERS IV SOLN
500.0000 mL | INTRAVENOUS | Status: DC | PRN
  Administered 2017-09-15: 1000 mL via INTRAVENOUS

## 2017-09-15 MED ORDER — LIDOCAINE HCL (PF) 1 % IJ SOLN
INTRAMUSCULAR | Status: DC | PRN
  Administered 2017-09-15: 7 mL via EPIDURAL
  Administered 2017-09-15: 5 mL via EPIDURAL

## 2017-09-15 MED ORDER — ONDANSETRON HCL 4 MG/2ML IJ SOLN
4.0000 mg | INTRAMUSCULAR | Status: DC | PRN
Start: 2017-09-15 — End: 2017-09-17

## 2017-09-15 MED ORDER — ZOLPIDEM TARTRATE 5 MG PO TABS: 5.0000 mg | ORAL_TABLET | Freq: Every evening | ORAL | Status: DC | PRN

## 2017-09-15 MED ORDER — BENZOCAINE-MENTHOL 20-0.5 % EX AERO: 1.0000 "application " | INHALATION_SPRAY | CUTANEOUS | Status: DC | PRN

## 2017-09-15 MED ORDER — OXYCODONE-ACETAMINOPHEN 5-325 MG PO TABS: 2.0000 | ORAL_TABLET | ORAL | Status: DC | PRN

## 2017-09-15 MED ORDER — TERBUTALINE SULFATE 1 MG/ML IJ SOLN
0.2500 mg | Freq: Once | INTRAMUSCULAR | Status: DC | PRN
  Filled 2017-09-15: qty 1

## 2017-09-15 MED ORDER — MISOPROSTOL 200 MCG PO TABS
ORAL_TABLET | ORAL | Status: AC
  Administered 2017-09-15: 600 ug via ORAL
  Filled 2017-09-15: qty 3

## 2017-09-15 MED ORDER — LACTATED RINGERS IV SOLN
INTRAVENOUS | Status: DC
  Administered 2017-09-15: 15:00:00 via INTRAVENOUS

## 2017-09-15 MED ORDER — ACETAMINOPHEN 325 MG PO TABS
650.0000 mg | ORAL_TABLET | ORAL | Status: DC | PRN
  Administered 2017-09-16: 650 mg via ORAL
  Filled 2017-09-15: qty 2

## 2017-09-15 MED ORDER — OXYTOCIN BOLUS FROM INFUSION
500.0000 mL | Freq: Once | INTRAVENOUS | Status: AC
  Administered 2017-09-15: 500 mL via INTRAVENOUS

## 2017-09-15 MED ORDER — SENNOSIDES-DOCUSATE SODIUM 8.6-50 MG PO TABS
2.0000 | ORAL_TABLET | ORAL | Status: DC
  Administered 2017-09-15 – 2017-09-17 (×2): 2 via ORAL
  Filled 2017-09-15 (×2): qty 2

## 2017-09-15 MED ORDER — WITCH HAZEL-GLYCERIN EX PADS: 1.0000 "application " | MEDICATED_PAD | CUTANEOUS | Status: DC | PRN

## 2017-09-15 MED ORDER — IBUPROFEN 600 MG PO TABS
600.0000 mg | ORAL_TABLET | Freq: Four times a day (QID) | ORAL | Status: DC
  Administered 2017-09-15 – 2017-09-17 (×7): 600 mg via ORAL
  Filled 2017-09-15 (×7): qty 1

## 2017-09-15 MED ORDER — FENTANYL 2.5 MCG/ML BUPIVACAINE 1/10 % EPIDURAL INFUSION (WH - ANES)
14.0000 mL/h | INTRAMUSCULAR | Status: DC | PRN
  Administered 2017-09-15: 14 mL/h via EPIDURAL
  Filled 2017-09-15: qty 100

## 2017-09-15 MED ORDER — MISOPROSTOL 200 MCG PO TABS
600.0000 ug | ORAL_TABLET | Freq: Once | ORAL | Status: AC
  Administered 2017-09-15: 600 ug via ORAL

## 2017-09-15 MED ORDER — FLEET ENEMA 7-19 GM/118ML RE ENEM: 1.0000 | ENEMA | RECTAL | Status: DC | PRN

## 2017-09-15 MED ORDER — OXYTOCIN 40 UNITS IN LACTATED RINGERS INFUSION - SIMPLE MED
2.5000 [IU]/h | INTRAVENOUS | Status: DC
  Administered 2017-09-15: 2.5 [IU]/h via INTRAVENOUS

## 2017-09-15 NOTE — Progress Notes (Signed)
POSTPARTUM PROGRESS NOTE  Post Partum Day 1 Subjective:  Tedra SenegalLaurie Smitherman is a 36 y.o. [id number] 6344w6d s/p NSVD.  No acute events overnight.  Pt denies problems with ambulating, voiding or po intake.  She denies nausea or vomiting.  Pain is well controlled.  Lochia Minimal.   Objective: Blood pressure 128/60, pulse 72, temperature 98.2 F (36.8 C), temperature source Oral, resp. rate 18, height 5\' 5"  (1.651 m), weight 219 lb (99.3 kg), last menstrual period 12/24/2016, SpO2 100 %, unknown if currently breastfeeding.  Physical Exam:  General: alert, cooperative and no distress Lochia:normal flow Chest: no respiratory distress Heart:regular rate, distal pulses intact Abdomen: soft, nontender,  Uterine Fundus: firm, appropriately tender DVT Evaluation: No calf swelling or tenderness Extremities: no edema  Recent Labs    09/15/17 1135 09/15/17 1926  HGB 12.9 11.9*  HCT 39.2 36.6    Assessment/Plan:  ASSESSMENT: Tedra SenegalLaurie Boschert is a 36 y.o. [id number] 7144w6d s/p NSVD.  Plan for discharge tomorrow   LOS: 1 day   Jadamarie Butson MossMD 09/16/2017, 7:16 AM

## 2017-09-15 NOTE — Anesthesia Pain Management Evaluation Note (Signed)
  CRNA Pain Management Visit Note  Patient: Andrea SenegalLaurie Lattin, 36 y.o., female  "Hello I am a member of the anesthesia team at [facility]. We have an anesthesia team available at all times to provide care throughout the hospital, including epidural management and anesthesia for C-section. I don't know your plan for the delivery whether it a natural birth, water birth, IV sedation, nitrous supplementation, doula or epidural, but we want to meet your pain goals."   1.Was your pain managed to your expectations on prior hospitalizations?   Yes   2.What is your expectation for pain management during this hospitalization?     Epidural  3.How can we help you reach that goal? Support prn  Record the patient's initial score and the patient's pain goal.   Pain: 4  Pain Goal: 5 [facility] wants you to be able to say your pain was always managed very well.  [facility],Jamina Macbeth 09/15/2017

## 2017-09-15 NOTE — Anesthesia Preprocedure Evaluation (Signed)
Anesthesia Evaluation  Patient identified by MRN, date of birth, ID band Patient awake    Reviewed: Allergy & Precautions, H&P , NPO status , Patient's Chart, lab work & pertinent test results  Airway Mallampati: II  TM Distance: >3 FB Neck ROM: full    Dental no notable dental hx. (+) Teeth Intact   Pulmonary neg pulmonary ROS,    Pulmonary exam normal breath sounds clear to auscultation       Cardiovascular negative cardio ROS Normal cardiovascular exam Rhythm:regular Rate:Normal     Neuro/Psych negative neurological ROS  negative psych ROS   GI/Hepatic negative GI ROS, Neg liver ROS,   Endo/Other  negative endocrine ROS  Renal/GU negative Renal ROS  negative genitourinary   Musculoskeletal negative musculoskeletal ROS (+)   Abdominal (+) + obese,   Peds  Hematology negative hematology ROS (+)   Anesthesia Other Findings   Reproductive/Obstetrics (+) Pregnancy                             Anesthesia Physical Anesthesia Plan  ASA: II  Anesthesia Plan: Epidural   Post-op Pain Management:    Induction:   PONV Risk Score and Plan:   Airway Management Planned:   Additional Equipment:   Intra-op Plan:   Post-operative Plan:   Informed Consent: I have reviewed the patients History and Physical, chart, labs and discussed the procedure including the risks, benefits and alternatives for the proposed anesthesia with the patient or authorized representative who has indicated his/her understanding and acceptance.       Plan Discussed with:   Anesthesia Plan Comments:         Anesthesia Quick Evaluation  

## 2017-09-15 NOTE — MAU Note (Signed)
Been feeling more consistent ctx's since 0600, now every 5-10 min.  Constant back pain. No bleeding or leaking.  Was 2 when last checked. For induction tomorrow.

## 2017-09-15 NOTE — Anesthesia Procedure Notes (Signed)
Epidural Patient location during procedure: OB Start time: 09/15/2017 2:19 PM End time: 09/15/2017 2:22 PM  Staffing Anesthesiologist: Leilani AbleHatchett, Leland Staszewski, MD Performed: anesthesiologist   Preanesthetic Checklist Completed: patient identified, site marked, surgical consent, pre-op evaluation, timeout performed, IV checked, risks and benefits discussed and monitors and equipment checked  Epidural Patient position: sitting Prep: site prepped and draped and DuraPrep Patient monitoring: continuous pulse ox and blood pressure Approach: midline Location: L3-L4 Injection technique: LOR air  Needle:  Needle type: Tuohy  Needle gauge: 17 G Needle length: 9 cm and 9 Needle insertion depth: 6 cm Catheter type: closed end flexible Catheter size: 19 Gauge Catheter at skin depth: 11 cm Test dose: negative and Other  Assessment Sensory level: T9 Events: blood not aspirated, injection not painful, no injection resistance, negative IV test and no paresthesia  Additional Notes Reason for block:procedure for pain

## 2017-09-15 NOTE — H&P (Signed)
LABOR AND DELIVERY ADMISSION HISTORY AND PHYSICAL NOTE  Andrea Alexander is a 36 y.o. female [phone] with IUP at 3365w6d by private vehicle presenting for active labor.  She reports positive fetal movement. She denies leakage of fluid or vaginal bleeding.  P[facility] History/Complications[facility] Pregnancy complications:  - dichorionic/diamniotic twin gestation -GBS (pos) -RV culture (pos)  Past Medical History: Past Medical History:  Diagnosis Date  . Medical history non-contributory   . Vaginal Pap smear, abnormal    with this preg, plan for rpt after delivery    Past Surgical History: Past Surgical History:  Procedure Laterality Date  . NO PAST SURGERIES    . WISDOM TOOTH EXTRACTION      Obstetrical History: OB History    Gravida Para Term Preterm AB Living   5 4 4     4    SAB TAB Ectopic Multiple Live Births           4      Social History: Social History   Socioeconomic History  . Marital status: Married    Spouse name: None  . Number of children: None  . Years of education: None  . Highest education level: None  Social Needs  . Financial resource strain: None  . Food insecurity - worry: None  . Food insecurity - inability: None  . Transportation needs - medical: None  . Transportation needs - non-medical: None  Occupational History  . None  Tobacco Use  . Smoking status: Never Smoker  . Smokeless tobacco: Never Used  Substance and Sexual Activity  . Alcohol use: No  . Drug use: No  . Sexual activity: Yes    Birth control/protection: None  Other Topics Concern  . None  Social History Narrative  . None    Family History: Family History  Problem Relation Age of Onset  . Diabetes Father   . Diabetes Maternal Grandfather   . Diabetes Paternal Grandmother   . Diabetes Paternal Grandfather     Allergies: No Known Allergies  Medications Prior to Admission  Medication Sig Dispense Refill Last Dose  . Omega-3 Fatty Acids (FISH OIL) 1000 MG  CAPS Take 1 capsule daily by mouth.   09/14/2017 at Unknown time  . Prenat-FeCbn-FeAspGl-FA-Omega (OB COMPLETE PETITE) 35-5-1-200 MG CAPS Take 1 tablet by mouth daily. 30 capsule 12 09/14/2017 at Unknown time  . Elastic Bandages & Supports (COMFORT FIT MATERNITY SUPP LG) MISC 1 Units by Does not apply route daily. (Patient not taking: Reported on 09/03/2017) 1 each 0 Not Taking  . hydrocortisone (ANUSOL-HC) 25 MG suppository Place 1 suppository (25 mg total) rectally 2 (two) times daily. (Patient not taking: Reported on 08/09/2017) 12 suppository 4 Not Taking     Review of Systems  All systems reviewed and negative except as stated in HPI  Physical Exam Blood pressure 137/71, pulse 75, temperature 98 F (36.7 C), temperature source Oral, resp. rate 16, weight 219 lb 4 oz (99.5 kg), last menstrual period 12/24/2016, SpO2 99 %. General appearance: alert, cooperative, appears stated age and no distress Lungs: clear to auscultation bilaterally Heart: regular rate and rhythm Abdomen: soft, non-tender; bowel sounds normal Extremities: bilateral/symettric 1+ pitting edema, (chronic for months per patient), neg Holman sign, no pain on calf palpation, distal pulse/sensation intact Presentation: cephalic per US on admission Fetal monitoring: reassuring Uterine activity: irregular contractions Dilation: 6 Effacement (%): 80 Station: -1 Exam by:: jolynn  Prenatal labs: ABO, Rh: O/Positive/-- (07/10 1142) Antibody: Negative (07/10 1142) Rubella: 1.37 (  07/10 1142) RPR: Non Reactive (09/04 1154)  HBsAg: Negative (07/10 1142)  HIV:    GC/Chlamydia: neg GBS: Positive (10/30 1340)  1 hr Glucola: 110 Genetic screening:  CF (neg), maternity 21 (not ran, poor sample) Anatomy US: no abnormalities noted on 7/19 US  Prenatal Transfer Tool  Maternal Diabetes: No Genetic Screening: Normal Maternal Ultrasounds/Referrals: Normal Fetal Ultrasounds or other Referrals:  None Maternal Substance Abuse:   No Significant Maternal Medications:  None Significant Maternal Lab Results: Lab values include: Group B Strep positive  Results for orders placed or performed during the hospital encounter of 09/15/17 (from the past 24 hour(s))  CBC   Collection Time: 09/15/17 11:35 AM  Result Value Ref Range   WBC 11.2 (H) 4.0 - 10.5 K/uL   RBC 4.33 3.87 - 5.11 MIL/uL   Hemoglobin 12.9 12.0 - 15.0 g/dL   HCT 16.139.2 09.636.0 - 04.546.0 %   MCV 90.5 78.0 - 100.0 fL   MCH 29.8 26.0 - 34.0 pg   MCHC 32.9 30.0 - 36.0 g/dL   RDW 40.913.2 81.111.5 - 91.415.5 %   Platelets 145 (L) 150 - 400 K/uL    Patient Active Problem List   Diagnosis Date Noted  . Twin pregnancy in third trimester 09/15/2017  . GBS (group B Streptococcus carrier), +RV culture, currently pregnant 09/03/2017  . Twin gestation, dichorionic diamniotic 05/20/2017  . Supervision of high risk pregnancy, antepartum 05/07/2017  . Late prenatal care 05/07/2017    Assessment: Andrea Alexander is a 36 y.o. [id number] at 7887w6d here for active labor with twin gestation (dichorionic/diamniotic)  #Labor: monitoring for progression, will consider pitocin if stalled #Pain: Patient well controlled currently, will attempt epidural #FWB: Reassuring for both twins #ID:  GBS pos, Pos RV culture #Circ:  Patient does not want to know sex of babies at this time  Twins (mom does not want to know sex/breast/copper IUD  Marthenia RollingScott Bland 09/15/2017, 12:22 PM  I was present for this assessment and agree with above assessment

## 2017-09-16 ENCOUNTER — Inpatient Hospital Stay (HOSPITAL_COMMUNITY)
Admission: RE | Admit: 2017-09-16 | Discharge: 2017-09-16 | Disposition: A | Discharge status: Home / Self Care | Payer: Medicaid Other | Source: Ambulatory Visit | Attending: Family Medicine | Admitting: Family Medicine

## 2017-09-16 LAB — RPR: RPR: NONREACTIVE

## 2017-09-16 MED ORDER — IBUPROFEN 600 MG PO TABS: 600.0000 mg | ORAL_TABLET | Freq: Four times a day (QID) | ORAL | 0 refills | Status: AC

## 2017-09-16 NOTE — Lactation Note (Signed)
This note was copied from a baby's chart. Lactation Consultation Note  Patient Name: Earnest RosierGirlB Merikay Colina ZOXWR'UToday's Date: 09/16/2017 Reason for consult: Initial assessment;Early term 37-38.6wks;Multiple gestation;Infant < 6lbs Breastfeeding consultation services and support information given and reviewed.  Twins are 5919 hours old and latching well per mom.  Mom has BF 4 previous babies.  Reviewed waking techniques and breast massage.  Discussed frequent feeds with any cues.  If babies become sleepy mom will ask for a DEBP to be initiated.  Discussed pumping and introducing a bottle.  Encouraged to call with any concerns/assist.  Maternal Data Does the patient have breastfeeding experience prior to this delivery?: Yes  Feeding Feeding Type: Breast Fed Length of feed: 15 min  LATCH Score                   Interventions    Lactation Tools Discussed/Used     Consult Status Consult Status: Follow-up Date: 09/17/17 Follow-up type: In-patient    Huston FoleyMOULDEN, Maythe Deramo S 09/16/2017, 12:47 PM

## 2017-09-16 NOTE — Discharge Summary (Signed)
OB Discharge Summary     Patient Name: Tedra SenegalLaurie Sellick DOB: 07/22/1981 MRN: [id number] Date of admission: 09/15/2017  Delivering MD:    Paulino RilyLiechty, BoyA Dellamae [[id number]][[id number]]  Gwen PoundsLAWSON, MARIE D   Aschenbrenner, GirlB Aranda [[id number]][[id number]]  Tilda BurrowFERGUSON, JOHN V )  Date of discharge: 09/17/2017    Admitting diagnosis: active labor Intrauterine pregnancy: 1421w6d    Secondary diagnosis:  Active Problems:   Patient Active Problem List   Diagnosis Date Noted  . Twin pregnancy in third trimester 09/15/2017  . GBS (group B Streptococcus carrier), +RV culture, currently pregnant 09/03/2017  . Twin gestation, dichorionic diamniotic 05/20/2017  . Supervision of high risk pregnancy, antepartum 05/07/2017  . Late prenatal care 05/07/2017    Additional problems: none     Discharge diagnosis: Term Pregnancy Delivered                                                                                                Post partum procedures:none  Complications: None  Hospital course:  Onset of Labor With Vaginal Delivery     36 y.o. yo G5P5006 at 1921w6d was admitted in Active Labor on 09/15/2017. Patient had an uncomplicated labor course as follows:  Membrane Rupture Time/Date:    Paulino RilyLiechty, BoyA Yuette [[id number]][[id number]]  4:30 PM   Earnest RosierLiechty, GirlB Shamonique [[id number]][[id number]]  4:30 PM ,   Paulino RilyLiechty, BoyA Laney [[id number]][[id number]]  09/15/2017   Earnest RosierLiechty, GirlB Dericka [[id number]][[id number]]  09/15/2017   Intrapartum Procedures: Episiotomy:    Paulino RilyLiechty, BoyA Porchia [[id number]][[id number]]  None [1]   Earnest RosierLiechty, GirlB Masiya [[id number]][[id number]]  None [1]                                         Lacerations:     Paulino RilyLiechty, BoyA Arayla [[id number]][[id number]]  None [1]   Earnest RosierLiechty, GirlB Scottie [[id number]][[id number]]  None [1]  Patient had a delivery of a Viable infant.   Paulino RilyLiechty, BoyA Savayah [[id number]][[id number]]  09/15/2017   Earnest RosierLiechty, GirlB Adna [[id number]][[id number]]  09/15/2017 Information for the patient's newborn:  Paulino RilyLiechty, BoyA Dorlisa [[id number]][[id number]]  Delivery Method: Vag-Spont Information for  the patient's newborn:  Earnest RosierLiechty, GirlB Tametha [[id number]][[id number]]  Delivery Method: Vaginal, Spontaneous(Filed from Delivery Summary)    Pateint had an uncomplicated postpartum course.  She is ambulating, tolerating a regular diet, passing flatus, and urinating well. Patient is discharged home in stable condition on 09/17/17.   Physical exam  Vitals:   09/16/17 1926 09/17/17 0538  BP: 112/71 (!) 106/58  Pulse: 64 69  Resp: 18 18  Temp: 98.2 F (36.8 C) 97.6 F (36.4 C)  SpO2:  98%    General: alert, cooperative and no distress Lochia: appropriate Uterine Fundus: firm Incision: N/A DVT Evaluation: No evidence of DVT seen on physical exam.  Labs: No results found for this or any previous visit (from the past 24 hour(s)).   Discharge instruction: per After Visit Summary and "Baby and Me Booklet".  After visit meds:  No Known Allergies  Allergies as of 09/17/2017   No Known Allergies  Medication List    STOP taking these medications   COMFORT FIT MATERNITY SUPP LG Misc   Fish Oil 1000 MG Caps   hydrocortisone 25 MG suppository Commonly known as:  ANUSOL-HC     TAKE these medications   ibuprofen 600 MG tablet Commonly known as:  ADVIL,MOTRIN Take 1 tablet (600 mg total) every 6 (six) hours by mouth.   OB COMPLETE PETITE 35-5-1-200 MG Caps Take 1 tablet by mouth daily.        Diet: routine diet  Activity: Advance as tolerated. Pelvic rest for 6 weeks.   Outpatient follow up:4 weeks Future Appointments:  Future Appointments  Date Time Provider Department Center  10/16/2017  1:00 PM Constant, Peggy, MD [facility] None    Follow up Appt: No Follow-up on file.     Postpartum contraception: IUD  Newborn Data: APGAR (1 MIN):    Paulino RilyLiechty, BoyA Sanika [[id number]][[id number]]  8   Earnest RosierLiechty, GirlB Shaindel [[id number]][[id number]]  8   APGAR (5 MINS):    Paulino RilyLiechty, BoyA Shanetha [[id number]][[id number]]  [address]9   Tabb, GirlB Tokiko [[id number]][[id number]]  8   APGAR (10 MINS):    Paulino RilyLiechty, BoyA Rickesha  [[id number]][[id number]]    Earnest RosierLiechty, GirlB Shalice [[id number]][[id number]]      Baby Feeding: Breast Disposition:home with mother  Rolm Bookbindermber Norinne Jeane, DO  09/17/2017

## 2017-09-16 NOTE — Anesthesia Postprocedure Evaluation (Signed)
Anesthesia Post Note  Patient: Tedra SenegalLaurie Brackin  Procedure(s) Performed: AN AD HOC LABOR EPIDURAL     Patient location during evaluation: Mother Baby Anesthesia Type: Epidural Level of consciousness: awake and alert and oriented Pain management: pain level controlled Vital Signs Assessment: post-procedure vital signs reviewed and stable Respiratory status: spontaneous breathing and nonlabored ventilation Cardiovascular status: stable Postop Assessment: no headache, patient able to bend at knees, no backache, no apparent nausea or vomiting, epidural receding and adequate PO intake Anesthetic complications: no    Last Vitals:  Vitals:   09/15/17 2050 09/16/17 0000  BP: 126/63 128/60  Pulse: 66 72  Resp: 18 18  Temp:  36.8 C  SpO2:      Last Pain:  Vitals:   09/16/17 0508  TempSrc:   PainSc: 4    Pain Goal:                 Laban EmperorMalinova,Rashan Patient Hristova

## 2017-09-16 NOTE — Discharge Instructions (Signed)

## 2017-09-17 ENCOUNTER — Encounter: Payer: Medicaid Other | Admitting: Obstetrics and Gynecology

## 2017-09-17 NOTE — Lactation Note (Signed)
This note was copied from a baby's chart. Lactation Consultation Note  Patient Name: Andrea Alexander'VToday's Date: 09/17/2017 Reason for consult: Follow-up assessment;Multiple gestation;Early term 37-38.6wks  Follow up visit at 43 hours of age.  Mom reports good feedings with 3886w6d  twins.  Baby A- Boy Shon BatonBrooks- Has had 9 breast feedings with 4v and 4 stools in past 24 hours. Baby is at 8.2% weight loss from 3.2% previous 24 hours.   Baby B - Girl Madilyn- Had has 9 breast feedings with 5voids and 5 stools.  Baby is at 6.9% weight loss from 2.2% Previous 24 hours.   Lc discussed with mom option to pump/ hand express to offer EBM supplement to baby before or after feedings.  Mom agreeable to set up DEBP and attempt spoon or syringe feedings as needed.  Mom plans to delay bottle feedings if she can. \  Plan is for mom to post pump a few times a day with hand expression to offer EBM to babies.   Mom is anticipating discharge.  LC discussed need to wake baby as needed and monitor feeding frequency (8-12X/24hr) as well as output.  Mom is experienced and denies concerns at this time.  Mom did ask for comfort gels because someone told her to.  Mom reports minimal soreness.  LC encouraged mom to hand express before during and after feedings and then apply colostrum to nipples. LC instructed mom on use of comfort gels.    Discussed milk transitioning to larger volume, engorgement care discussed.  Encouraged frequent feedings waking baby as needed. Mom to soften breast as needed prior to latch.  Mom aware of o/p services and follow up as needed.      Maternal Data Has patient been taught Hand Expression?: Yes Does the patient have breastfeeding experience prior to this delivery?: Yes  Feeding Length of feed: 30 min  LATCH Score                   Interventions Interventions: Support pillows;Comfort gels  Lactation Tools Discussed/Used Tools: Pump Pump Review: Setup, frequency,  and cleaning;Milk Storage Initiated by:: JS IBCLC  Date initiated:: 09/18/17   Consult Status Consult Status: Complete    Franz DellJana Enijah Furr 09/17/2017, 12:39 PM

## 2017-09-20 ENCOUNTER — Ambulatory Visit (HOSPITAL_COMMUNITY): Payer: Medicaid Other

## 2017-09-24 ENCOUNTER — Encounter: Payer: Medicaid Other | Admitting: Obstetrics and Gynecology

## 2017-09-27 ENCOUNTER — Ambulatory Visit (HOSPITAL_COMMUNITY): Payer: Medicaid Other

## 2017-09-27 ENCOUNTER — Other Ambulatory Visit (HOSPITAL_COMMUNITY): Payer: Medicaid Other

## 2017-10-16 ENCOUNTER — Ambulatory Visit (INDEPENDENT_AMBULATORY_CARE_PROVIDER_SITE_OTHER): Payer: Medicaid Other | Admitting: Obstetrics and Gynecology

## 2017-10-16 ENCOUNTER — Encounter: Payer: Self-pay | Admitting: Obstetrics and Gynecology

## 2017-10-16 DIAGNOSIS — Z30430 Encounter for insertion of intrauterine contraceptive device: Secondary | ICD-10-CM

## 2017-10-16 DIAGNOSIS — Z3202 Encounter for pregnancy test, result negative: Secondary | ICD-10-CM | POA: Diagnosis not present

## 2017-10-16 DIAGNOSIS — Z1389 Encounter for screening for other disorder: Secondary | ICD-10-CM

## 2017-10-16 LAB — POCT URINE PREGNANCY: Preg Test, Ur: NEGATIVE

## 2017-10-16 MED ORDER — PARAGARD INTRAUTERINE COPPER IU IUD
1.0000 | INTRAUTERINE_SYSTEM | Freq: Once | INTRAUTERINE | Status: AC
  Administered 2017-10-16: 1 via INTRAUTERINE

## 2017-10-16 NOTE — Addendum Note (Signed)
Addended by: Dalphine HandingGARDNER, Lisandra Mathisen L on: 10/16/2017 01:42 PM   Modules accepted: Orders

## 2017-10-16 NOTE — Addendum Note (Signed)
Addended by: Hamilton CapriBURCH, ARIEL J on: 10/16/2017 01:37 PM   Modules accepted: Orders

## 2017-10-16 NOTE — Progress Notes (Signed)
Subjective:     Andrea SenegalLaurie Alexander is a 36 y.o. female who presents for a postpartum visit. She is 4 weeks postpartum following a spontaneous vaginal delivery. I have fully reviewed the prenatal and intrapartum course. The delivery was at 37.6 gestational weeks. Outcome: spontaneous vaginal delivery. Anesthesia: epidural. Postpartum course has been uncomplicated. Baby's course has been uncomplicated. Baby is feeding by both breast and bottle - Enfamil with Iron. Bleeding staining only. Bowel function is normal. Bladder function is normal. Patient is not sexually active. Contraception method is none. Postpartum depression screening: negative.     Review of Systems Pertinent items are noted in HPI.   Objective:    BP 133/83   Pulse 79   Wt 197 lb (89.4 kg)   Breastfeeding? Yes   BMI 32.78 kg/m   General:  alert, cooperative and no distress   Breasts:  inspection negative, no nipple discharge or bleeding, no masses or nodularity palpable  Lungs: clear to auscultation bilaterally  Heart:  regular rate and rhythm  Abdomen: soft, non-tender; bowel sounds normal; no masses,  no organomegaly   Vulva:  normal  Vagina: normal vagina, no discharge, exudate, lesion, or erythema  Cervix:  multiparous appearance  Corpus: normal size, contour, position, consistency, mobility, non-tender  Adnexa:  normal adnexa and no mass, fullness, tenderness  Rectal Exam:         Assessment:     Normal postpartum exam. Pap smear not done at today's visit.   Plan:    1. Contraception: IUD  IUD Procedure Note Patient identified, informed consent performed, signed copy in chart, time out was performed.  Urine pregnancy test negative.  Speculum placed in the vagina.  Cervix visualized.  Cleaned with Betadine x 2.  Grasped anteriorly with a single tooth tenaculum.  Uterus sounded to 8 cm.  Paraguard IUD placed per manufacturer's recommendations using sterile techniques.  Strings trimmed to 3 cm. Tenaculum was  removed, good hemostasis noted.  Patient tolerated procedure well.   Patient given post procedure instructions and Paraguard care card with expiration date.  Patient is asked to check IUD strings periodically and follow up in 4-6 weeks for IUD check.   2. Patient is medically cleared to resume her activities of daily living 3. Follow up in: 4 weeks for IUD check or as needed.

## 2017-10-23 ENCOUNTER — Encounter: Payer: Self-pay | Admitting: *Deleted

## 2017-11-13 ENCOUNTER — Ambulatory Visit: Payer: Medicaid Other | Admitting: Obstetrics & Gynecology

## 2018-05-29 ENCOUNTER — Ambulatory Visit (INDEPENDENT_AMBULATORY_CARE_PROVIDER_SITE_OTHER): Payer: Medicaid Other | Admitting: Obstetrics and Gynecology

## 2018-05-29 ENCOUNTER — Other Ambulatory Visit: Payer: Self-pay | Admitting: Obstetrics and Gynecology

## 2018-05-29 ENCOUNTER — Ambulatory Visit (HOSPITAL_COMMUNITY)
Admission: RE | Admit: 2018-05-29 | Discharge: 2018-05-29 | Disposition: A | Discharge status: Home / Self Care | Payer: Medicaid Other | Source: Ambulatory Visit | Attending: Obstetrics and Gynecology | Admitting: Obstetrics and Gynecology

## 2018-05-29 ENCOUNTER — Encounter: Payer: Self-pay | Admitting: Obstetrics and Gynecology

## 2018-05-29 VITALS — BP 139/87 | HR 86 | Wt 189.2 lb

## 2018-05-29 DIAGNOSIS — Z3201 Encounter for pregnancy test, result positive: Secondary | ICD-10-CM

## 2018-05-29 DIAGNOSIS — Z30432 Encounter for removal of intrauterine contraceptive device: Secondary | ICD-10-CM | POA: Diagnosis not present

## 2018-05-29 DIAGNOSIS — Z331 Pregnant state, incidental: Secondary | ICD-10-CM

## 2018-05-29 DIAGNOSIS — N920 Excessive and frequent menstruation with regular cycle: Secondary | ICD-10-CM | POA: Diagnosis not present

## 2018-05-29 DIAGNOSIS — N923 Ovulation bleeding: Secondary | ICD-10-CM

## 2018-05-29 LAB — POCT URINE PREGNANCY: PREG TEST UR: POSITIVE — AB

## 2018-05-29 NOTE — Progress Notes (Signed)
Pt c/o heavy VB with cramping since June 26th. +UPT at home. Pt also c/o rectal pain.

## 2018-05-29 NOTE — Progress Notes (Signed)
37 yo s/p Paraguard IUD placement in 09/2017 who is here for IUD removal due to positive home pregnancy test. Patient reports a monthly normal period with a normal period in June. She reports onset of persistent vaginal bleeding on 6/22 associated with cramping pain. Patient is without any other complaints  Past Medical History:  Diagnosis Date  . Medical history non-contributory   . Vaginal Pap smear, abnormal    with this preg, plan for rpt after delivery   Past Surgical History:  Procedure Laterality Date  . NO PAST SURGERIES    . WISDOM TOOTH EXTRACTION     Family History  Problem Relation Age of Onset  . Diabetes Father   . Diabetes Maternal Grandfather   . Diabetes Paternal Grandmother   . Diabetes Paternal Grandfather    Social History   Tobacco Use  . Smoking status: Never Smoker  . Smokeless tobacco: Never Used  Substance Use Topics  . Alcohol use: No  . Drug use: No   ROS See pertinent in HPI  GENERAL: Well-developed, well-nourished female in no acute distress.  ABDOMEN: Soft, nontender, nondistended. No organomegaly. PELVIC: Normal external female genitalia. Vagina is pink and rugated.  Normal discharge. Normal appearing cervix. IUD strings seen at the os extending 2 cm. Uterus is normal in size. No adnexal mass or tenderness. EXTREMITIES: No cyanosis, clubbing, or edema, 2+ distal pulses.  A/P 37 yo G6P5006 with pregnancy with IUD - IUD removed without difficulty with the use of a ring forceps - quant HCG today - pelvic ultrasound ordered to assess location of pregnancy - patient will be contacted with results

## 2018-05-30 ENCOUNTER — Telehealth: Payer: Self-pay

## 2018-05-30 LAB — BETA HCG QUANT (REF LAB): hCG Quant: 170 m[IU]/mL

## 2018-05-30 NOTE — Telephone Encounter (Signed)
Attempted to contact about U/S results and HCG follow up. No answer, unable to leave message, vm full.

## 2018-05-30 NOTE — Telephone Encounter (Signed)
Pt returned call, advised of results and treatment plan, pt agreed.

## 2018-05-30 NOTE — Telephone Encounter (Signed)
-----   Message from Catalina AntiguaPeggy Constant, MD sent at 05/29/2018  3:21 PM EDT ----- Please inform patient that her ultrasound did not show a pregnancy inside her uterus or an ectopic pregnancy. This could be an early pregnancy to small to visualize or a failed pregnancy give the bleeding that she experienced.  The plan remains the same as previously discussed. She needs to come 48 hours from today for repeat quant HCG in maternity admission. I realize that she is self pay and can return to the office on Monday for the same blood work in order to avoid an emergency room charge, if desired. Trending the quant HCG will help us determine if this is a normal pregnancy or not.  If however, she experiences pelvic pain, she needs to be evaluated in the emergency room  Thanks  [facility] Hospitaleggy

## 2018-05-30 NOTE — Telephone Encounter (Signed)
Attempted to contact pt again. No answer Vm full.

## 2018-06-02 ENCOUNTER — Other Ambulatory Visit: Payer: Medicaid Other

## 2018-06-02 DIAGNOSIS — Z331 Pregnant state, incidental: Secondary | ICD-10-CM

## 2018-06-03 ENCOUNTER — Telehealth: Payer: Self-pay

## 2018-06-03 LAB — BETA HCG QUANT (REF LAB): HCG QUANT: 89 m[IU]/mL

## 2018-06-03 NOTE — Telephone Encounter (Signed)
Pt called requesting results for her recent quant. Results reviewed with pt. Pt has concern if this could be a possible ectopic pregnancy. She states that has not bled since getting her IUD removed, and is no longer having pain. Pt would like to know if a possible ectopic needs to be ruled out, and what the next step is for her. Please advise

## 2018-06-09 ENCOUNTER — Other Ambulatory Visit: Payer: Self-pay

## 2018-06-09 DIAGNOSIS — Z331 Pregnant state, incidental: Secondary | ICD-10-CM

## 2018-06-09 NOTE — Progress Notes (Unsigned)
b

## 2018-06-10 LAB — BETA HCG QUANT (REF LAB): hCG Quant: 52 m[IU]/mL

## 2018-06-16 ENCOUNTER — Other Ambulatory Visit: Payer: Self-pay

## 2018-08-16 IMAGING — US US MFM OB FOLLOW-UP EACH ADDL GEST (MODIFY)
1 series · 12 of 28 positions shown · non-contrast
Comparison: none

[Series 1: us mfm ob follow-up each addl gest (modify) · 12 of 95 slices shown]
[im 4/95]
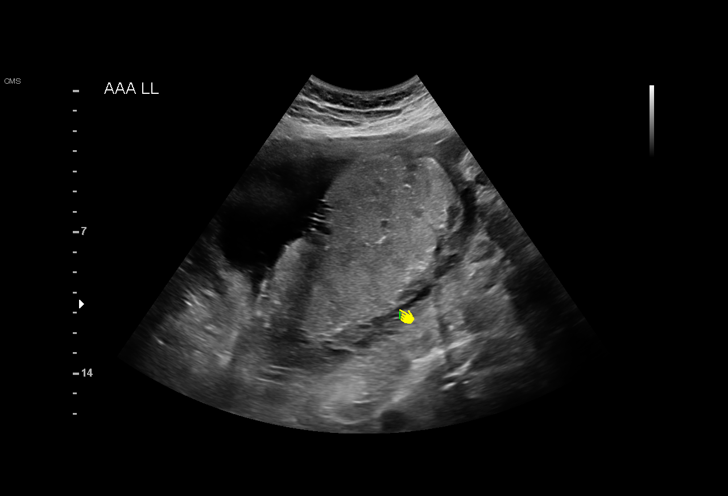
[im 11/95]
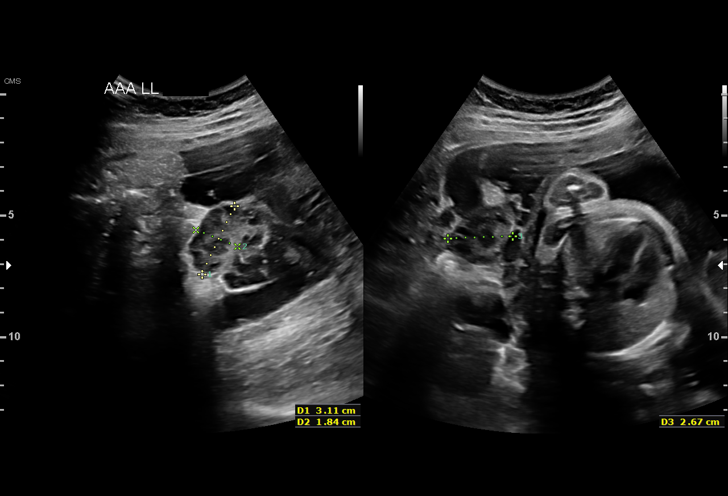
[im 18/95]
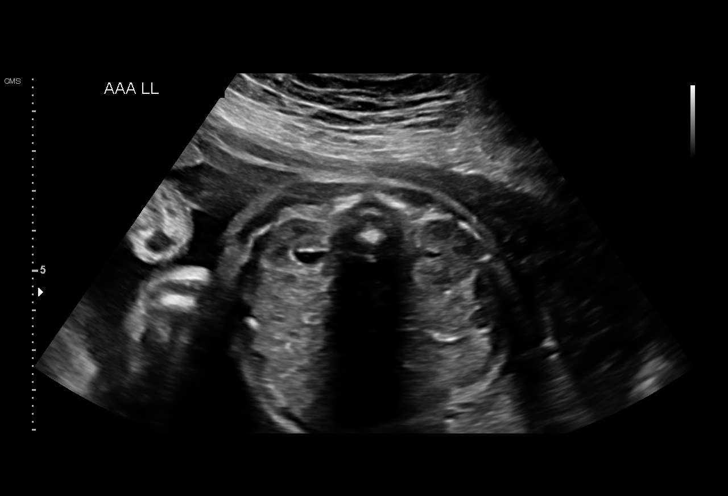
[im 28/95]
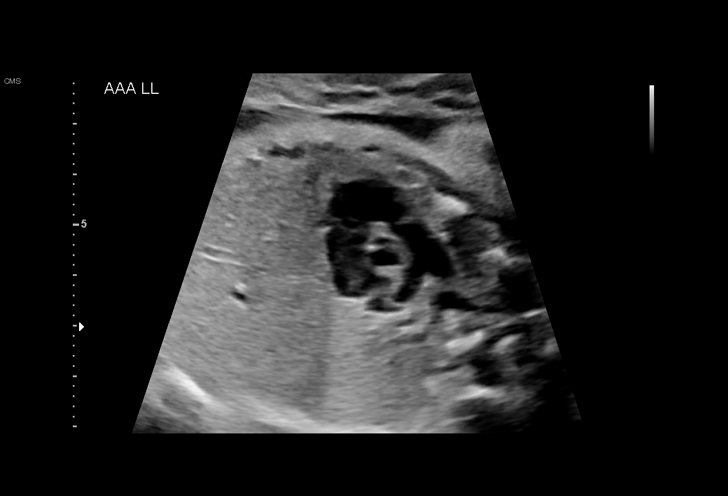
[im 35/95]
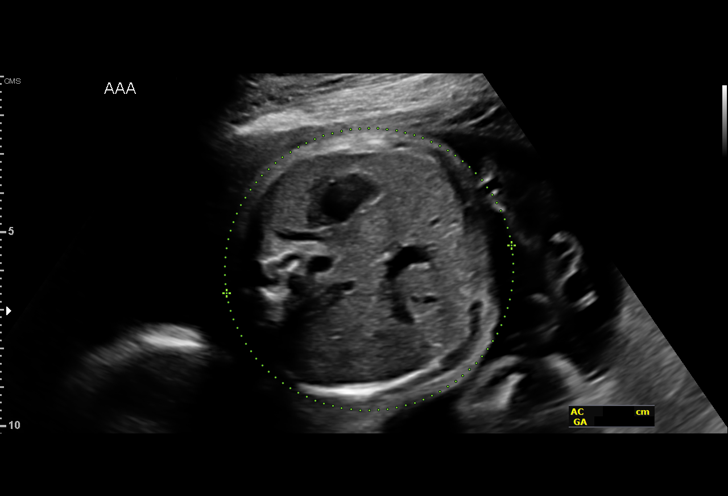
[im 42/95]
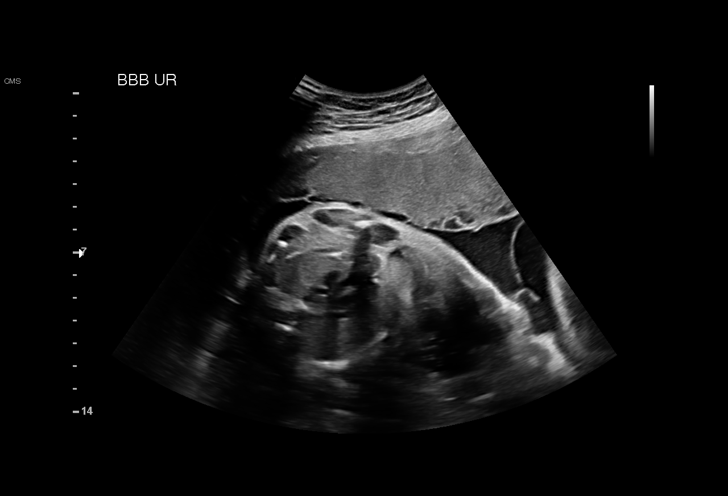
[im 53/95]
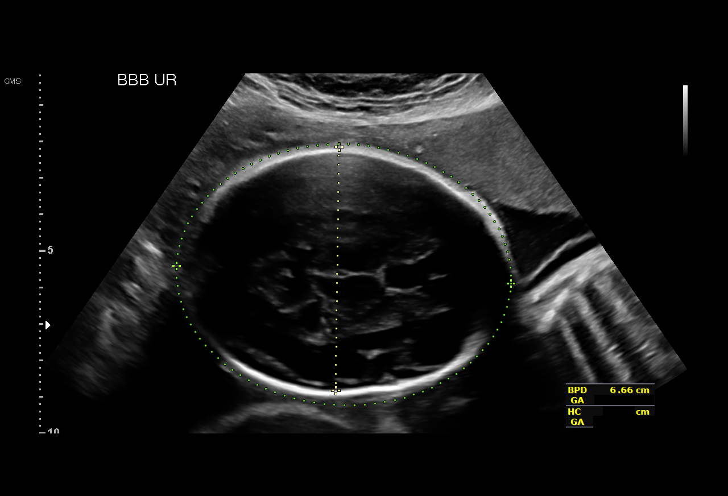
[im 60/95]
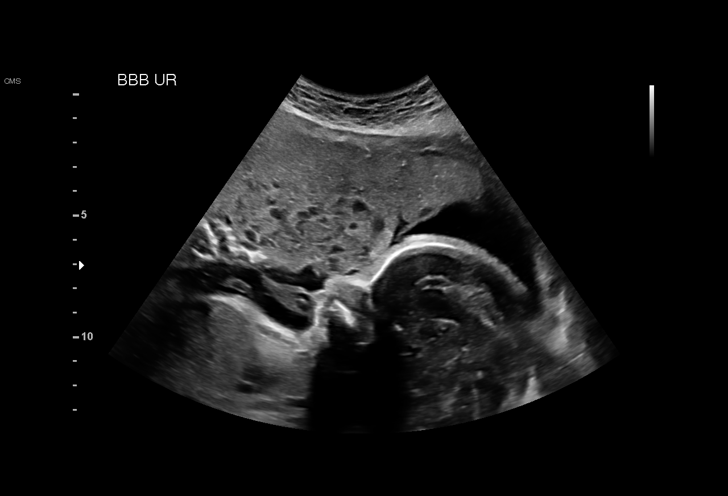
[im 67/95]
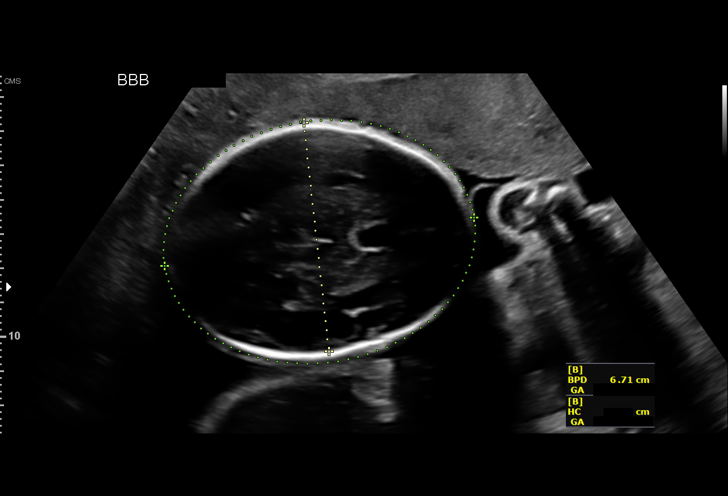
[im 77/95]
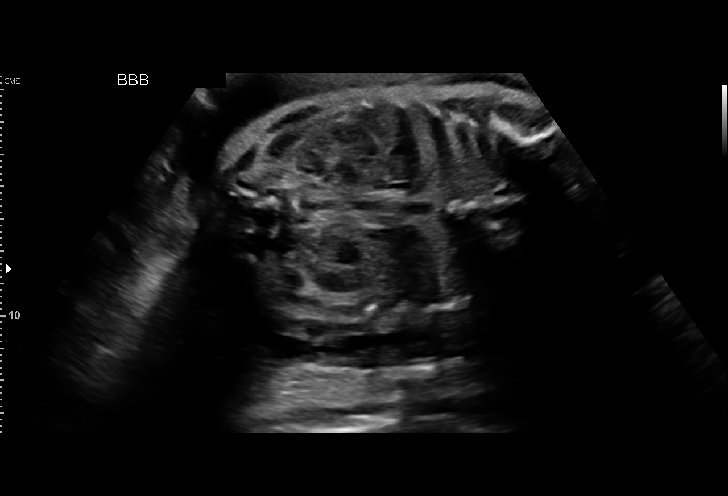
[im 84/95]
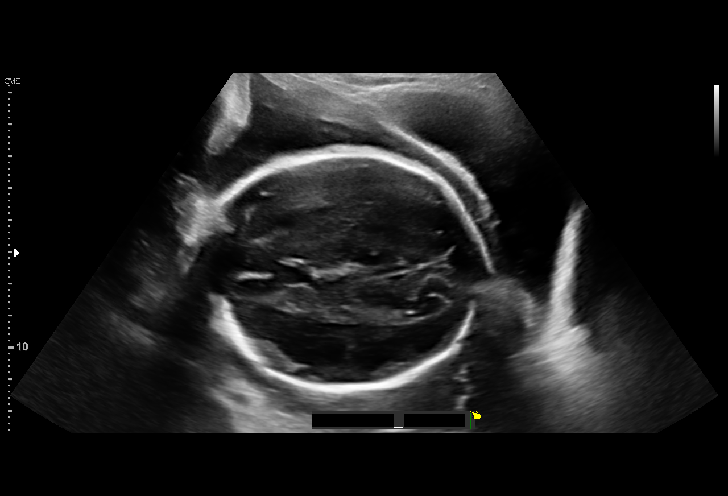
[im 91/95]
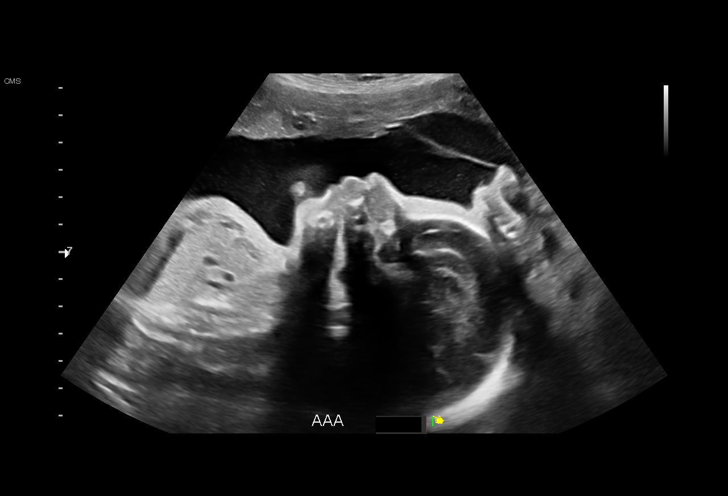

[12 of 28 positions shown; findings below may reference images not displayed]

Road [HOSPITAL]

1  FELNER CEOLA              [id number]      [id number]     [id number]
2  FELNER CEOLA              [id number]      [id number]     [id number]
Indications

28 weeks gestation of pregnancy
Twin pregnancy, di/di, third trimester
OB History

Blood Type:            Height:  5'5"   Weight (lb):  181       BMI:
Gravidity:    5         Term:   4        Prem:   0        SAB:   0
TOP:          0       Ectopic:  0        Living: 4
Fetal Evaluation (Fetus A)

Num Of Fetuses:     2
Fetal Heart         157
Rate(bpm):
Cardiac Activity:   Observed
Fetal Lie:          Lower Fetus; maternal left
Presentation:       Cephalic
Placenta:           Posterior, above cervical os
P. Cord Insertion:  Visualized
Membrane Desc:      Dividing Membrane seen - Dichorionic.

Amniotic Fluid
AFI FV:      Subjectively within normal limits

Largest Pocket(cm)
3.94
Biometry (Fetus A)
BPD:        73  mm     G. Age:  29w 2d         62  %    CI:        75.31   %    70 - 86
FL/HC:      19.2   %    19.6 -
HC:      266.8  mm     G. Age:  29w 1d         33  %    HC/AC:      1.15        0.99 -
AC:       232   mm     G. Age:  27w 4d         17  %    FL/BPD:     70.0   %    71 - 87
FL:       51.1  mm     G. Age:  27w 2d          9  %    FL/AC:      22.0   %    20 - 24
HUM:      47.6  mm     G. Age:  28w 0d         33  %

Est. FW:    8885  gm      2 lb 7 oz     34  %     FW Discordancy         4  %
Gestational Age (Fetus A)

LMP:           28w 4d        Date:  12/24/16                 EDD:   09/30/17
U/S Today:     28w 2d                                        EDD:   10/02/17
Best:          28w 4d     Det. By:  LMP  (12/24/16)          EDD:   09/30/17
Anatomy (Fetus A)

Cranium:               Appears normal         Aortic Arch:            Appears normal
Cavum:                 Appears normal         Ductal Arch:            Previously seen
Ventricles:            Appears normal         Diaphragm:              Appears normal
Choroid Plexus:        Appears normal         Stomach:                Appears normal, left
sided
Cerebellum:            Appears normal         Abdomen:                Appears normal
Posterior Fossa:       Appears normal         Abdominal Wall:         Previously seen
Nuchal Fold:           Previously seen        Cord Vessels:           Appears normal (3
vessel cord)
Face:                  Appears normal         Kidneys:                Appear normal
(orbits and profile)
Lips:                  Appears normal         Bladder:                Appears normal
Thoracic:              Appears normal         Spine:                  Previously seen
Heart:                 Appears normal         Upper Extremities:      Previously seen
(4CH, axis, and situs
RVOT:                  Appears normal         Lower Extremities:      Previously seen
LVOT:                  Appears normal

Other:  Male gender previously seen. Heels previously visualized.

Fetal Evaluation (Fetus B)

Num Of Fetuses:     2
Cardiac Activity:   Observed
Fetal Lie:          Upper Fetus; maternal right
Presentation:       Cephalic
Placenta:           Anterior, above cervical os
P. Cord Insertion:  Visualized
Membrane Desc:      Dividing Membrane seen - Dichorionic.

Amniotic Fluid
AFI FV:      Subjectively within normal limits

Largest Pocket(cm)
4.0
Biometry (Fetus B)

BPD:      66.7  mm     G. Age:  26w 6d          4  %    CI:        69.22   %    70 - 86
FL/HC:      19.7   %    19.6 -
HC:       256   mm     G. Age:  27w 6d          7  %    HC/AC:      1.04        0.99 -
AC:      246.2  mm     G. Age:  28w 6d         52  %    FL/BPD:     75.7   %    71 - 87
FL:       50.5  mm     G. Age:  27w 1d          7  %    FL/AC:      20.5   %    20 - 24
HUM:        47  mm     G. Age:  27w 5d         26  %

Est. FW:    8897  gm      2 lb 9 oz     40  %     FW Discordancy      0 \ 4 %
Gestational Age (Fetus B)

LMP:           28w 4d        Date:  12/24/16                 EDD:   09/30/17
U/S Today:     27w 5d                                        EDD:   10/06/17
Best:          28w 4d     Det. By:  LMP  (12/24/16)          EDD:   09/30/17
Anatomy (Fetus B)

Cranium:               Appears normal         Aortic Arch:            Previously seen
Cavum:                 Appears normal         Ductal Arch:            Previously seen
Ventricles:            Appears normal         Diaphragm:              Appears normal
Choroid Plexus:        Appears normal         Stomach:                Appears normal, left
sided
Cerebellum:            Appears normal         Abdomen:                Appears normal
Posterior Fossa:       Appears normal         Abdominal Wall:         Previously seen
Nuchal Fold:           Previously seen        Cord Vessels:           Appears normal (3
vessel cord)
Face:                  Orbits nl; profile     Kidneys:                Appear normal
prev visualized
Lips:                  Appears normal         Bladder:                Appears normal
Thoracic:              Appears normal         Spine:                  Previously seen
Heart:                 Appears normal         Upper Extremities:      Previously seen
(4CH, axis, and situs
RVOT:                  Appears normal         Lower Extremities:      Previously seen
LVOT:                  Appears normal

Other:  Female gender previously seen.
Cervix Uterus Adnexa

Uterus
No abnormality visualized.

Left Ovary
Within normal limits.

Right Ovary
Within normal limits.

Cul De Sac:   No free fluid seen.

Adnexa:       No abnormality visualized.
Impression

Diamniotic Dichorionic intrauterine pregnancy at 28+4 weeks
Presentation is cephalic/cephalic
Normal amniotic fluid in each sac, with  MVP of 3.9 cm for A
and 4.0 cm for B
Estimated fetal weights are 1119g for A and 1165g for B,
34th and  40th percentiles respectively. Discordance is 4%
Review of anatomy shows no evidence of structural
anomalies or sonographic markers for aneuploidy
Recommendations

Repeat evaluation in 4 weeks

## 2018-10-04 IMAGING — US US MFM FETAL BPP W/O NON-STRESS
1 series · 13 of 28 positions shown · non-contrast
Comparison: none

[Series 1: us mfm fetal bpp w/o non-stress · 41 acquisitions, 13 frames shown]
[im 2/41]
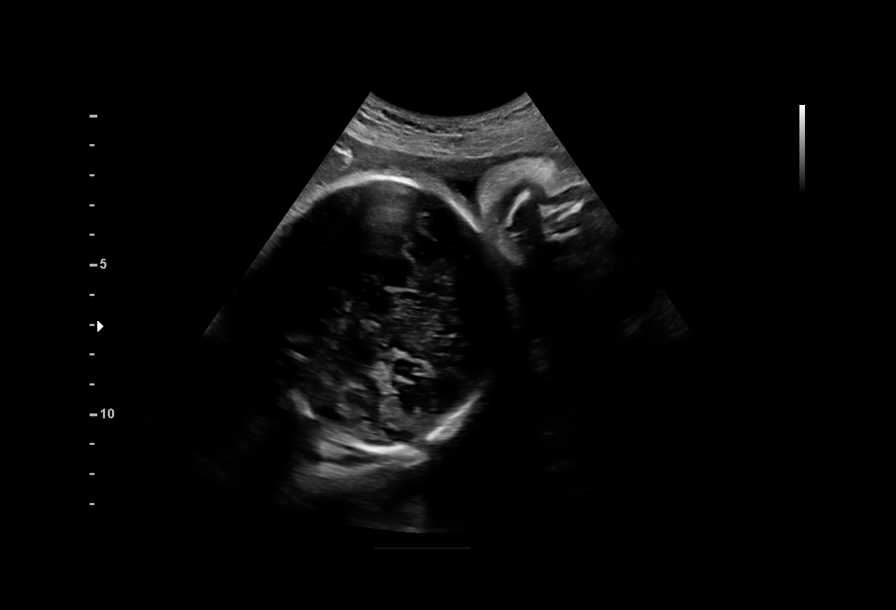
[im 5/41]
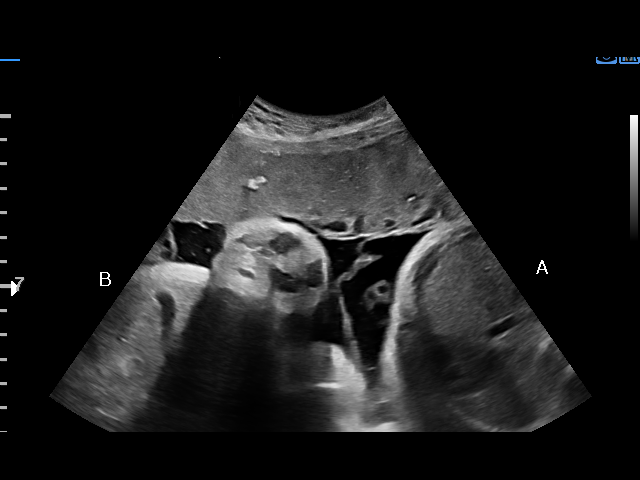
[im 8/41]
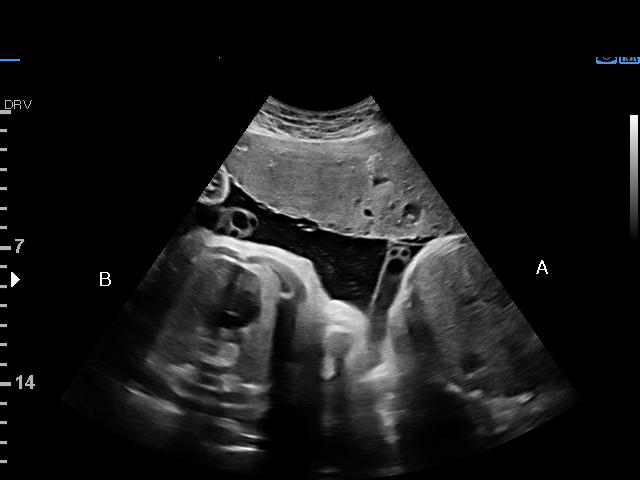
[im 11/41]
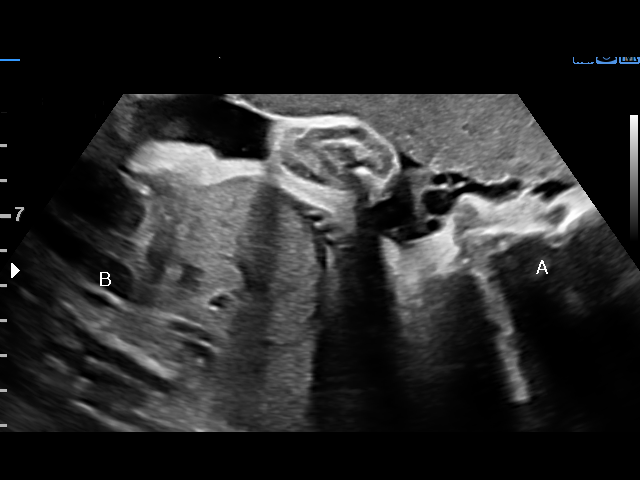
[im 14/41]
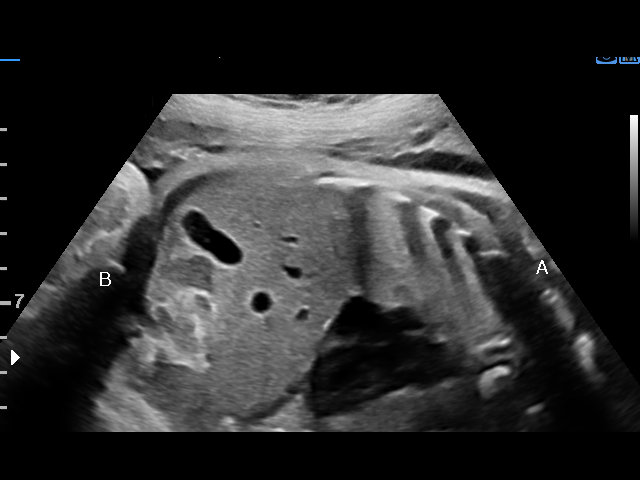
[im 17/41]
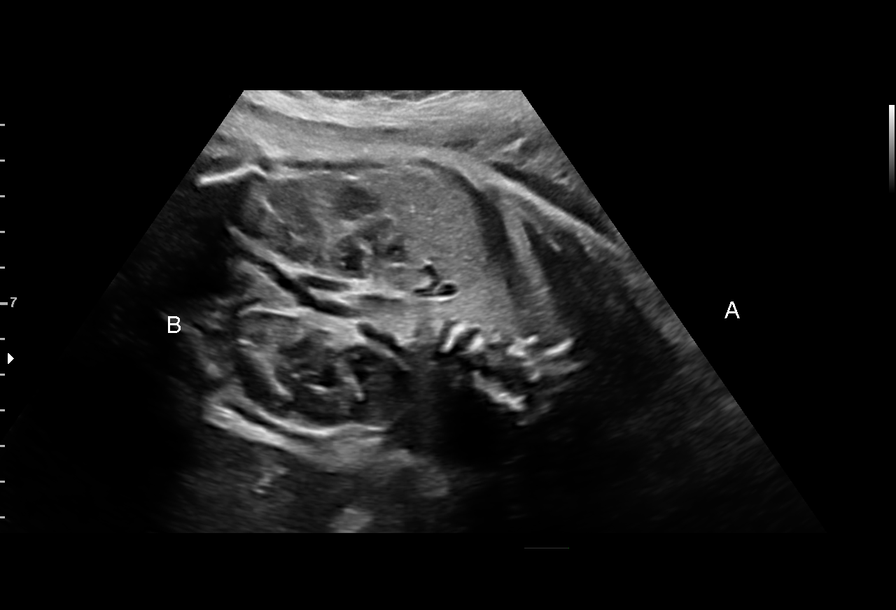
[im 21/41]
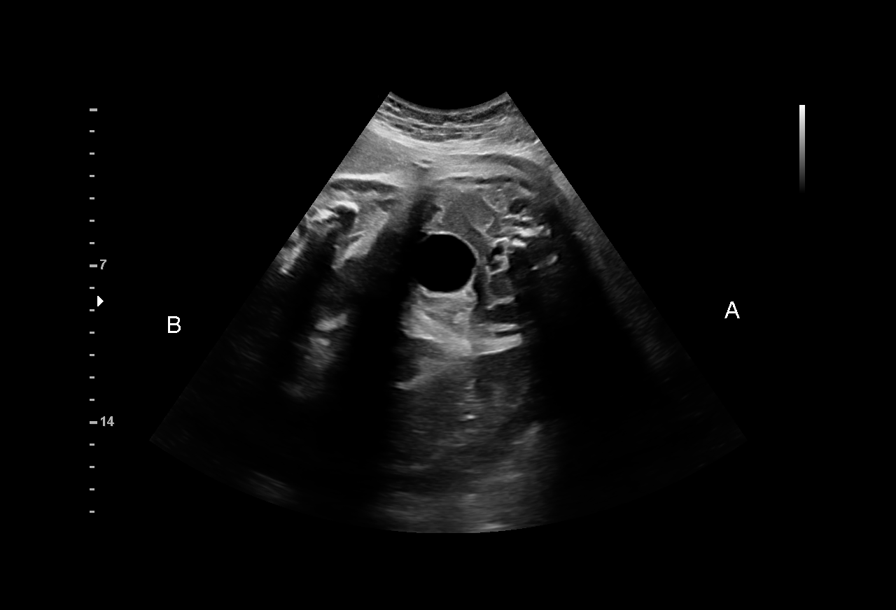
[im 24/41]
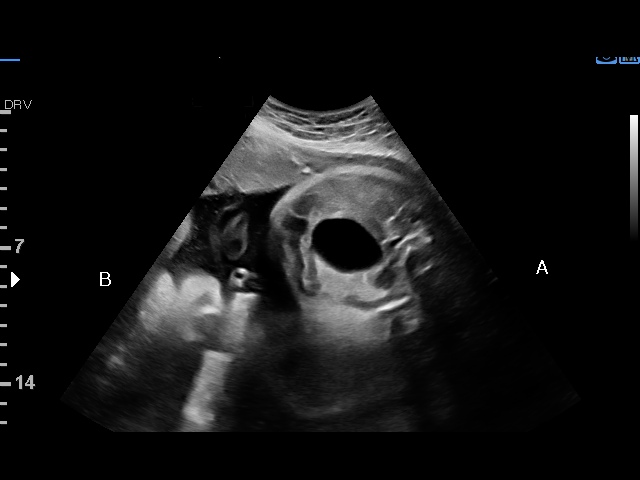
[im 27/41]
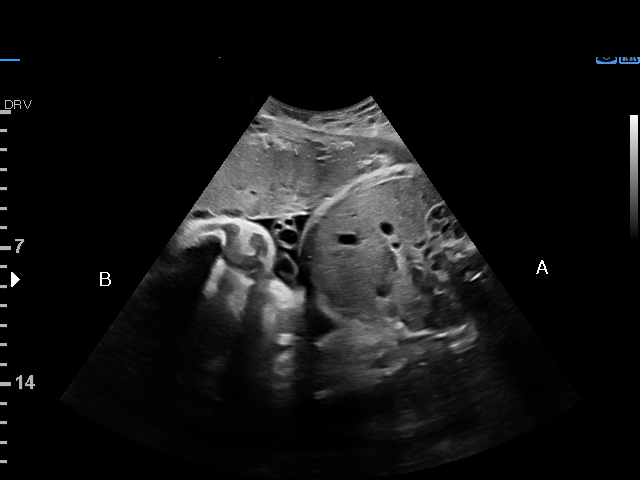
[im 30/41]
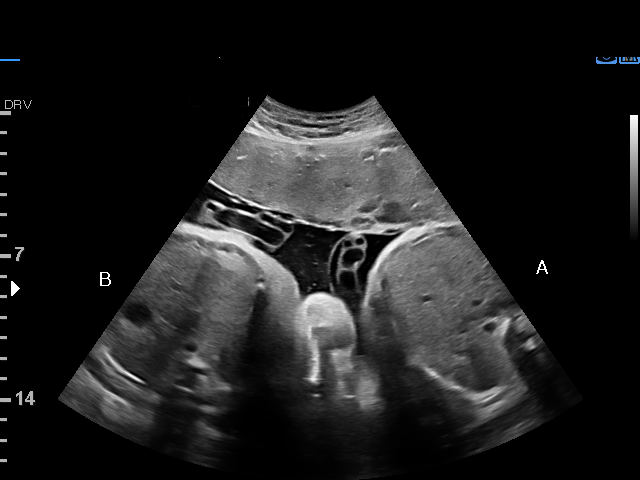
[im 33/41]
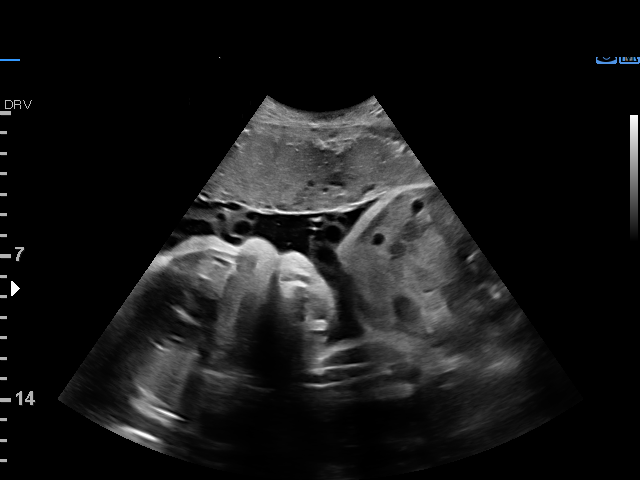
[im 36/41]
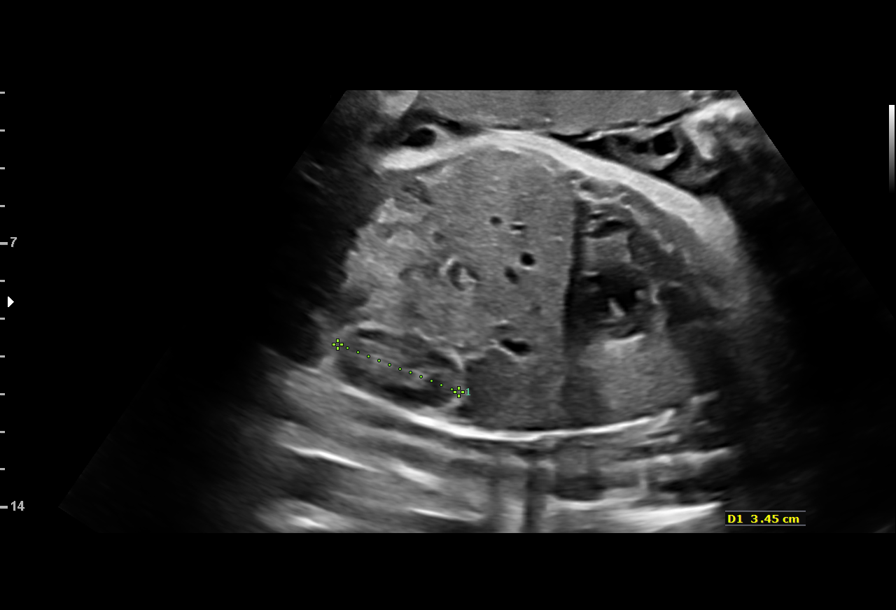
[im 39/41]
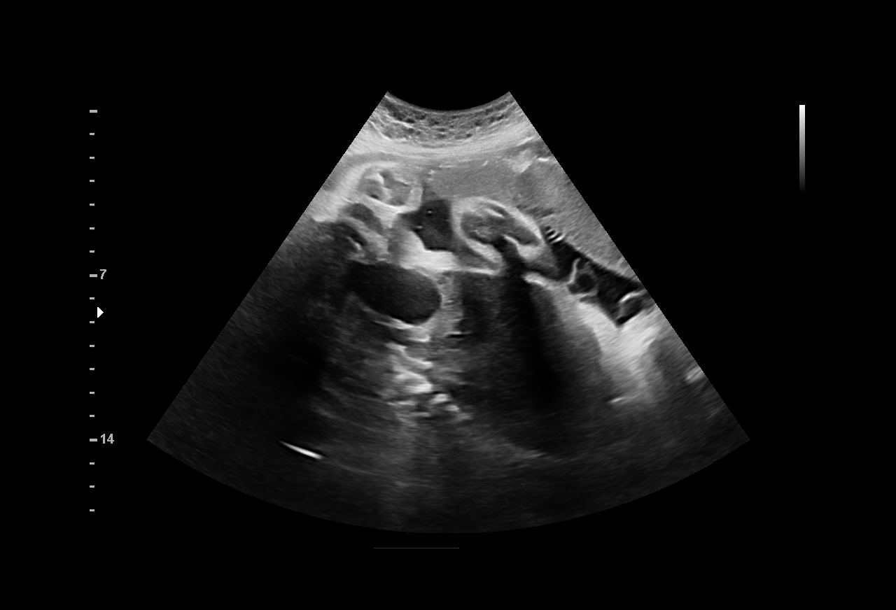

[13 of 28 positions shown; findings below may reference images not displayed]

Road [HOSPITAL]

GESTATION

1  JEAN-MARC HARRY DESIRAL              [id number]      [id number]     [id number]
2  JEAN-MARC HARRY DESIRAL              [id number]      [id number]     [id number]
Indications

35 weeks gestation of pregnancy
Twin pregnancy, di/di, third trimester
Late to prenatal care, third trimester
OB History

Blood Type:            Height:  5'5"   Weight (lb):  181       BMI:
Gravidity:    5         Term:   4        Prem:   0        SAB:   0
TOP:          0       Ectopic:  0        Living: 4
Fetal Evaluation (Fetus A)

Num Of Fetuses:     2
Fetal Heart         134
Rate(bpm):
Cardiac Activity:   Observed
Fetal Lie:          Maternal left side
Presentation:       Cephalic
Membrane Desc:      Dividing Membrane seen

Amniotic Fluid
AFI FV:      Subjectively within normal limits

Largest Pocket(cm)
4.73
Biophysical Evaluation (Fetus A)
Amniotic F.V:   Within normal limits       F. Tone:        Observed
F. Movement:    Observed                   Score:          [DATE]
F. Breathing:   Observed
Gestational Age (Fetus A)

LMP:           35w 4d        Date:  12/24/16                 EDD:   09/30/17
Best:          35w 4d     Det. By:  LMP  (12/24/16)          EDD:   09/30/17
Anatomy (Fetus A)

Stomach:               Appears normal, left   Bladder:                Appears normal
sided
Kidneys:               Appear normal

Fetal Evaluation (Fetus B)

Num Of Fetuses:     2
Fetal Heart         133
Rate(bpm):
Cardiac Activity:   Observed
Fetal Lie:          Maternal right side
Presentation:       Cephalic
Membrane Desc:      Dividing Membrane seen

Amniotic Fluid
AFI FV:      Subjectively within normal limits

Largest Pocket(cm)
4.72
Biophysical Evaluation (Fetus B)

Amniotic F.V:   Within normal limits       F. Tone:        Observed
F. Movement:    Observed                   Score:          [DATE]
F. Breathing:   Observed
Gestational Age (Fetus B)

LMP:           35w 4d        Date:  12/24/16                 EDD:   09/30/17
Best:          35w 4d     Det. By:  LMP  (12/24/16)          EDD:   09/30/17
Anatomy (Fetus B)

Stomach:               Appears normal, left   Bladder:                Appears normal
sided
Kidneys:               Appear normal
Cervix Uterus Adnexa

Cervix
Not visualized (advanced GA >11wks)
Impression

Di Di twin intrauterine pregnancy at 35 weeks 4 days
gestation with fetal cardiac activity x2
Cephalic cephalic presentation
BPP [DATE] x 2 with normal appearing amniotic fluid volume x 2
Recommendations

Continue antenatal testing and serial ultrasound for fetal
growth.
Recommend delivery by 38 weeks if maternal and fetal status
remain reassuring.

## 2018-10-11 IMAGING — US US MFM OB FOLLOW-UP EACH ADDL GEST (MODIFY)
1 series · 12 of 28 positions shown · non-contrast
Comparison: none

[Series 1: us mfm ob follow-up each addl gest (modify) · 48 acquisitions, 12 frames shown]
[im 2/48]
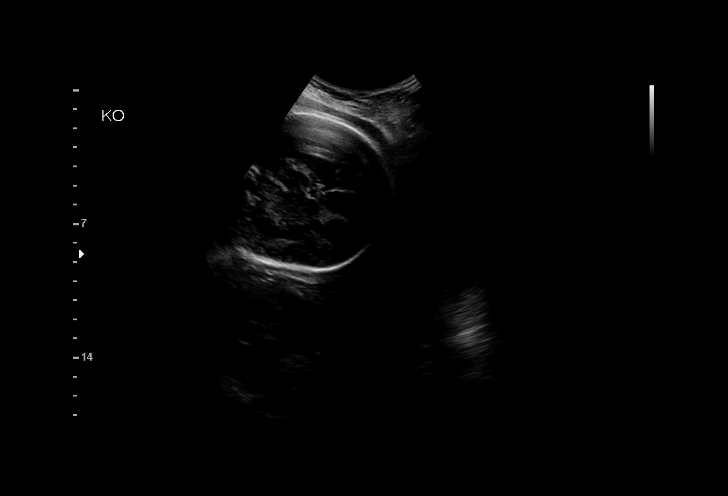
[im 6/48]
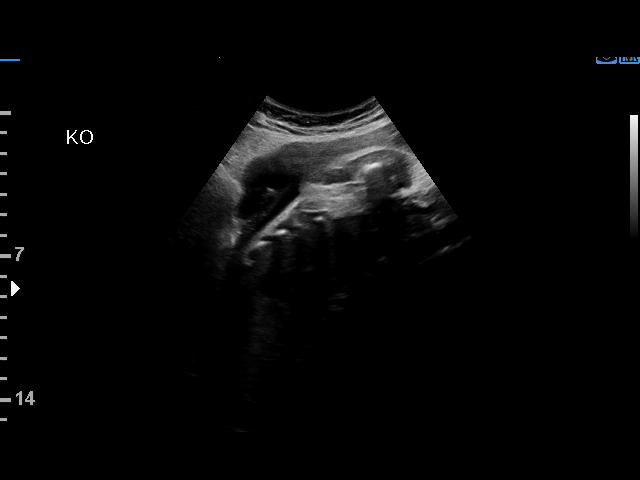
[im 9/48]
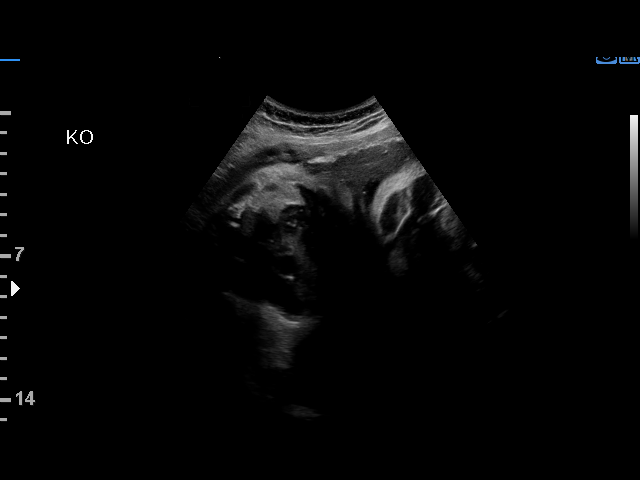
[im 14/48]
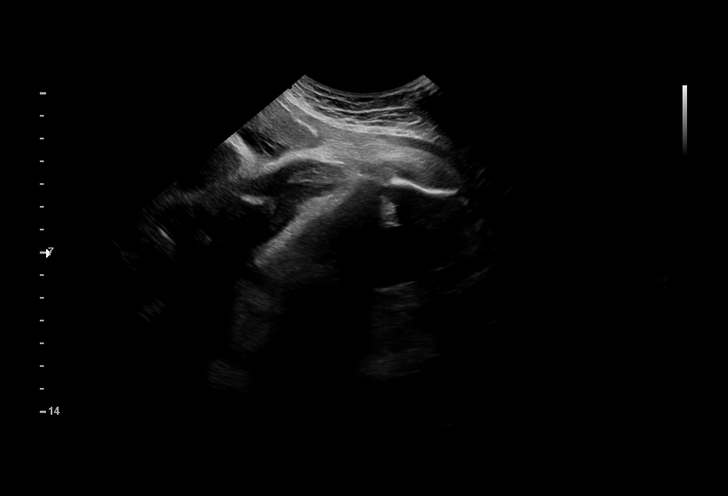
[im 18/48]
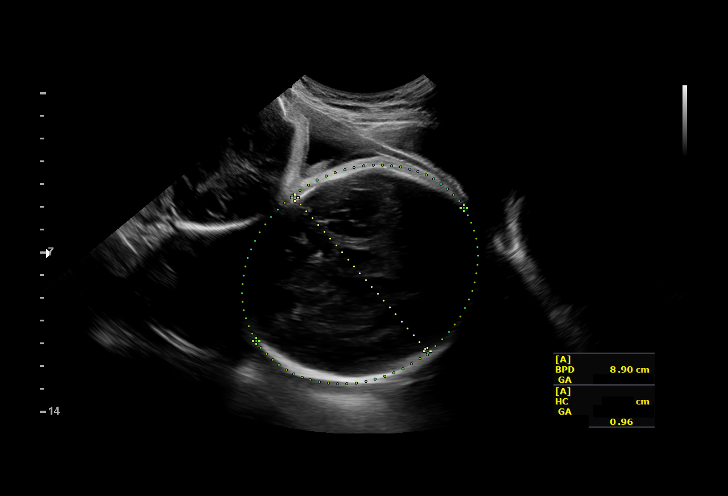
[im 21/48]
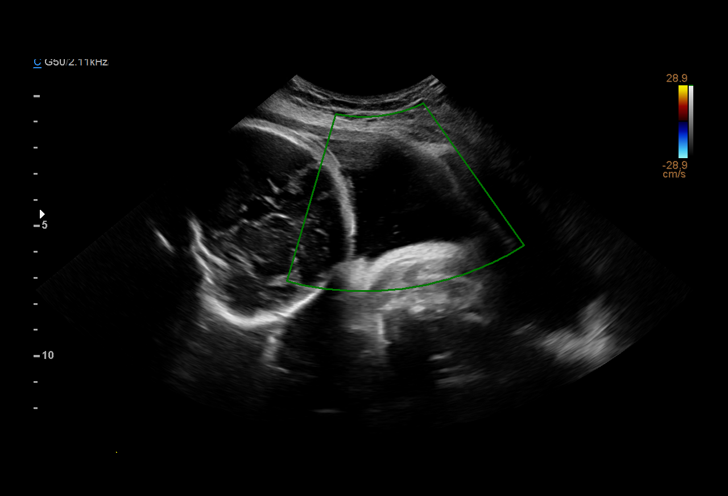
[im 27/48]
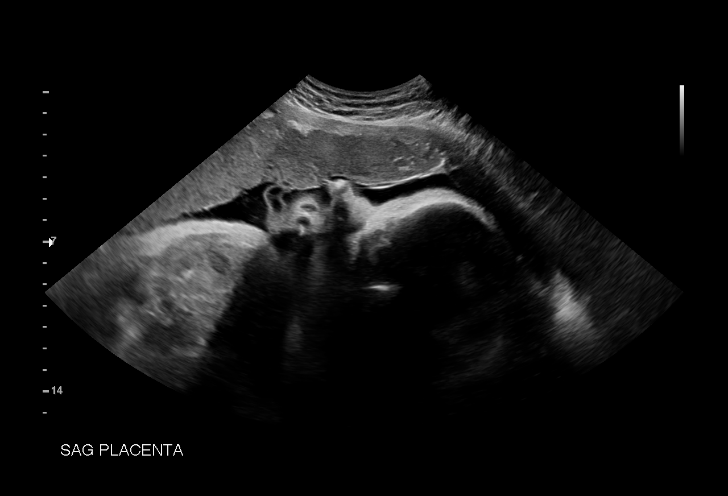
[im 30/48]
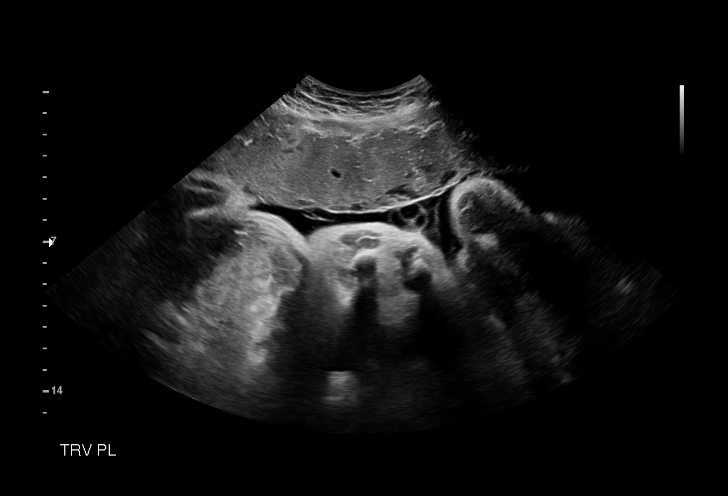
[im 34/48]
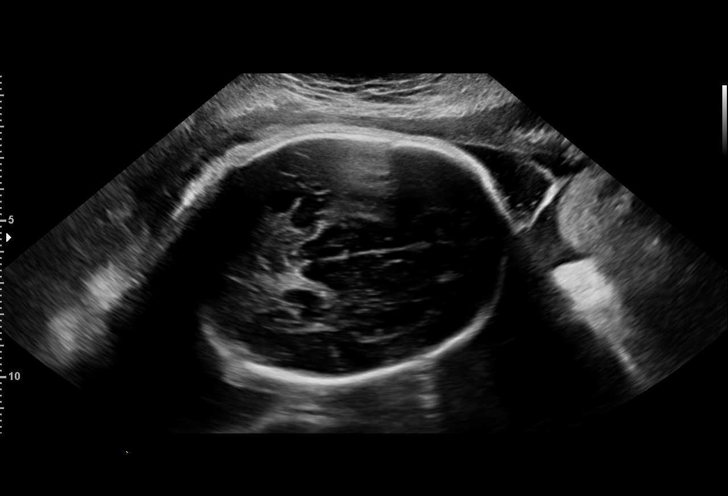
[im 39/48]
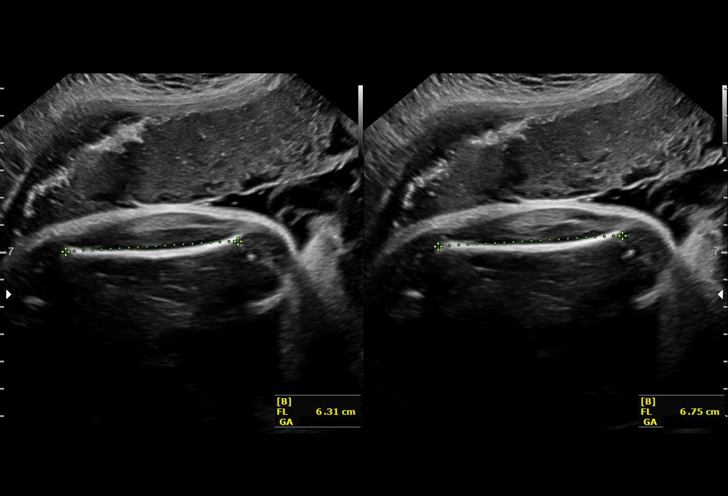
[im 42/48]
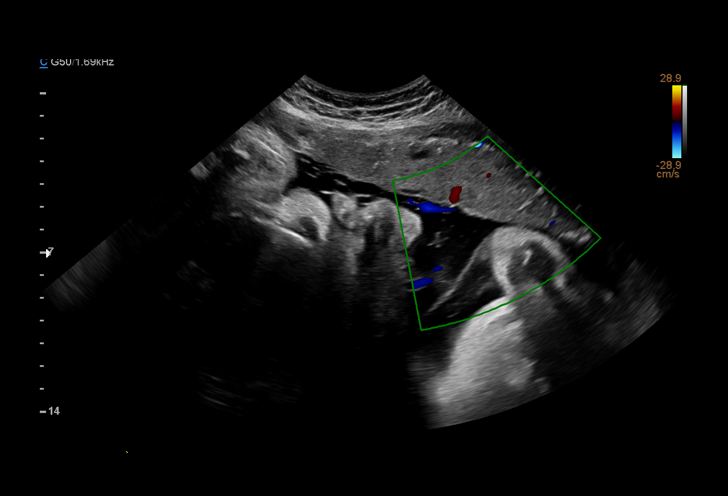
[im 46/48]
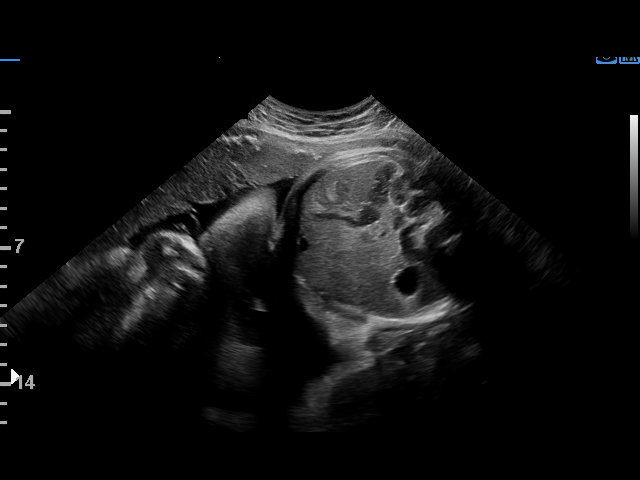

[12 of 28 positions shown; findings below may reference images not displayed]

pm)

Road [HOSPITAL]

GESTATION

1  NAMRATA LEGASPI              [id number]      [id number]     [id number]
2  KARLITA GERMANY            [id number]      [id number]     [id number]
3  NAMRATA LEGASPI              [id number]      [id number]     [id number]
4  NAMRATA LEGASPI              [id number]      [id number]     [id number]
Indications

36 weeks gestation of pregnancy
Twin pregnancy, di/di, third trimester
Late to prenatal care, third trimester
OB History

Blood Type:            Height:  5'5"   Weight (lb):  181       BMI:
Gravidity:    5         Term:   4        Prem:   0        SAB:   0
TOP:          0       Ectopic:  0        Living: 4
Fetal Evaluation (Fetus A)

Num Of Fetuses:     2
Fetal Heart         133
Rate(bpm):
Cardiac Activity:   Observed
Fetal Lie:          Maternal left side
Presentation:       Cephalic
Placenta:           Posterior, above cervical os
P. Cord Insertion:  Previously Visualized
Membrane Desc:      Dividing Membrane seen
Amniotic Fluid
AFI FV:      Subjectively within normal limits

Largest Pocket(cm)
4.0
Biophysical Evaluation (Fetus A)

Amniotic F.V:   Within normal limits       F. Tone:        Observed
F. Movement:    Observed                   N.S.T:          Reactive
F. Breathing:   Observed                   Score:          [DATE]
Biometry (Fetus A)

BPD:      89.1  mm     G. Age:  36w 0d         47  %    CI:        79.61   %    70 - 86
FL/HC:      21.7   %    20.8 -
HC:      315.6  mm     G. Age:  35w 3d          7  %    HC/AC:      0.99        0.92 -
AC:      319.4  mm     G. Age:  35w 6d         42  %    FL/BPD:     76.8   %    71 - 87
FL:       68.4  mm     G. Age:  35w 1d         16  %    FL/AC:      21.4   %    20 - 24
HUM:      60.4  mm     G. Age:  35w 0d         38  %

Est. FW:    3387  gm           6 lb     45  %     FW Discordancy      0 \ 5 %
Gestational Age (Fetus A)

LMP:           36w 4d        Date:  12/24/16                 EDD:   09/30/17
U/S Today:     35w 4d                                        EDD:   10/07/17
Best:          36w 4d     Det. By:  LMP  (12/24/16)          EDD:   09/30/17
Anatomy (Fetus A)

Cranium:               Appears normal         Aortic Arch:            Previously seen
Cavum:                 Previously seen        Ductal Arch:            Previously seen
Ventricles:            Previously seen        Diaphragm:              Previously seen
Choroid Plexus:        Previously seen        Stomach:                Appears normal, left
sided
Cerebellum:            Previously seen        Abdomen:                Previously seen
Posterior Fossa:       Previously seen        Abdominal Wall:         Previously seen
Nuchal Fold:           Previously seen        Cord Vessels:           Previously seen
Face:                  Orbits and profile     Kidneys:                Appear normal
previously seen
Lips:                  Previously seen        Bladder:                Appears normal
Thoracic:              Appears normal         Spine:                  Previously seen
Heart:                 Previously seen        Upper Extremities:      Previously seen
RVOT:                  Appears normal         Lower Extremities:      Previously seen
LVOT:                  Previously seen

Other:  Male gender previously seen. Heels previously visualized previously.

Fetal Evaluation (Fetus B)

Num Of Fetuses:     2
Fetal Heart         135
Rate(bpm):
Cardiac Activity:   Observed
Fetal Lie:          Maternal right side
Presentation:       Cephalic
Placenta:           Anterior, above cervical os
P. Cord Insertion:  Previously Visualized
Membrane Desc:      Dividing Membrane seen
Amniotic Fluid
AFI FV:      Subjectively within normal limits

Largest Pocket(cm)
3.3
Biophysical Evaluation (Fetus B)

Amniotic F.V:   Within normal limits       F. Tone:        Observed
F. Movement:    Observed                   N.S.T:          Reactive
F. Breathing:   Not Observed               Score:          [DATE]
Biometry (Fetus B)

BPD:      77.5  mm     G. Age:  31w 1d          0  %    CI:        67.51   %    70 - 86
FL/HC:      21.6   %    20.8 -
HC:      301.9  mm     G. Age:  33w 3d        < 3  %    HC/AC:      0.92        0.92 -
AC:      329.9  mm     G. Age:  36w 6d         71  %    FL/BPD:     84.3   %    71 - 87
FL:       65.3  mm     G. Age:  33w 5d        < 3  %    FL/AC:      19.8   %    20 - 24
HUM:      57.2  mm     G. Age:  33w 1d          6  %

Est. FW:    5238  gm    5 lb 11 oz      30  %     FW Discordancy         5  %
Gestational Age (Fetus B)

LMP:           36w 4d        Date:  12/24/16                 EDD:   09/30/17
U/S Today:     33w 6d                                        EDD:   10/19/17
Best:          36w 4d     Det. By:  LMP  (12/24/16)          EDD:   09/30/17
Anatomy (Fetus B)



Other:  Female gender previously seen.
Cervix Uterus Adnexa

Cervix
Not visualized (advanced GA >50wks)

Uterus
No abnormality visualized.

Left Ovary
No adnexal mass visualized.
Right Ovary
No adnexal mass visualized.

Cul De Sac:   No free fluid seen.

Adnexa:       No abnormality visualized.
Impression

Di di twin gestation at 36 weeks 4 days gestation with fetal
cardiac activity x 2
Cephalic /cephalic presentation
Normal appearing fetal growth and amniotic fluid x2 with a
discordance of 5%
BPP [DATE] for twin A and BPP[DATE] (-2 for fetal breathing) for
twin B
Recommendations

Continue antenatal testing and serial ultrasound for fetal
growth.
Recommend delivery by 38 weeks if maternal and fetal status
remain reassuring.

## 2018-10-18 IMAGING — US US MFM FETAL BPP W/O NON-STRESS
1 series · 12 of 28 positions shown · non-contrast
Comparison: none

[Series 1: us mfm fetal bpp w/o non-stress · 30 acquisitions, 12 frames shown]
[im 2/30]
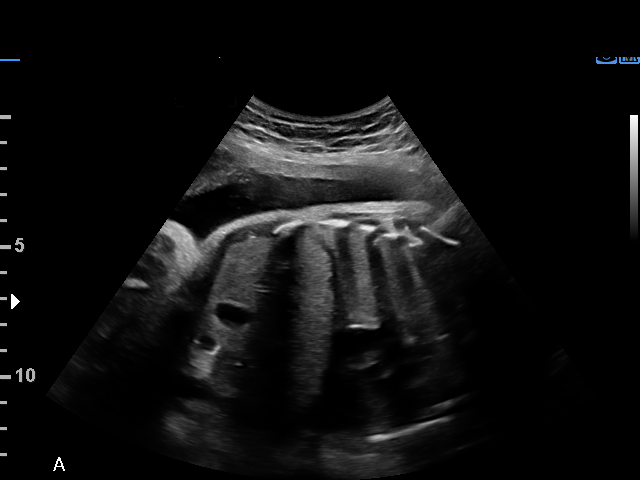
[im 4/30]
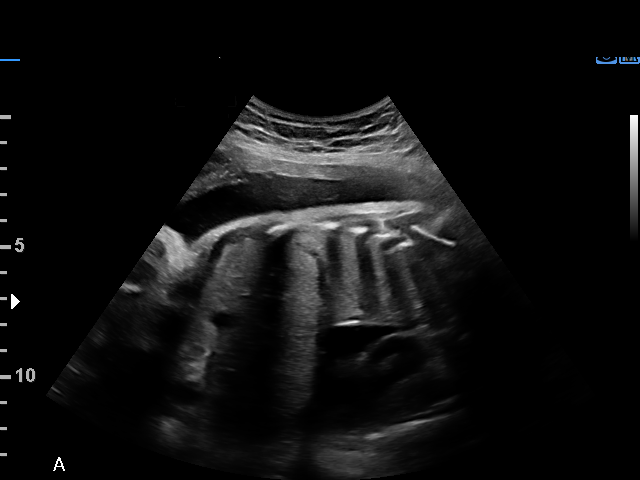
[im 6/30]
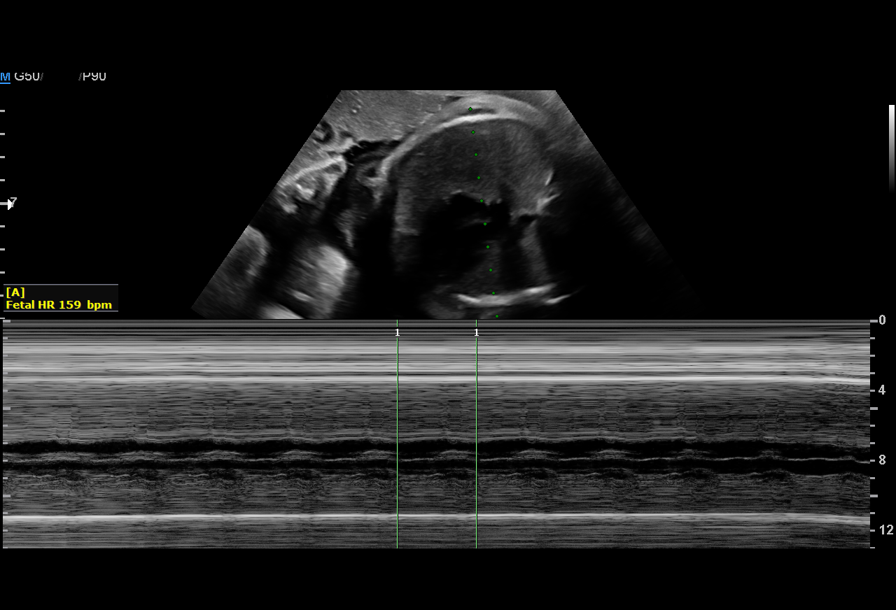
[im 9/30]
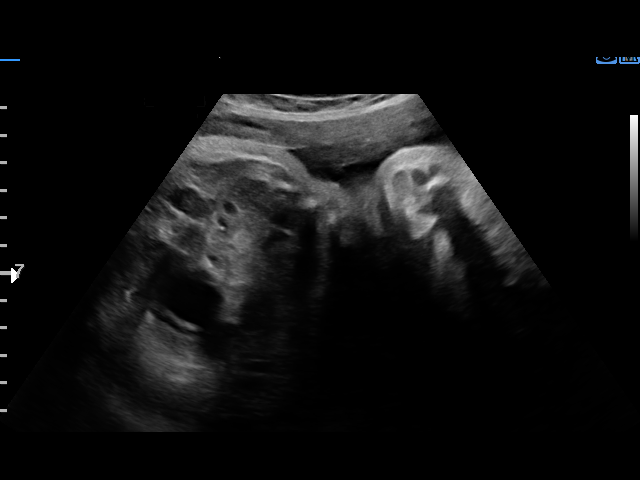
[im 11/30]
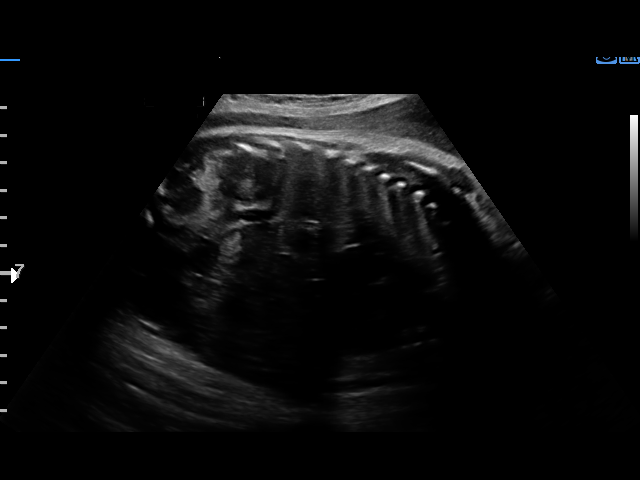
[im 13/30]
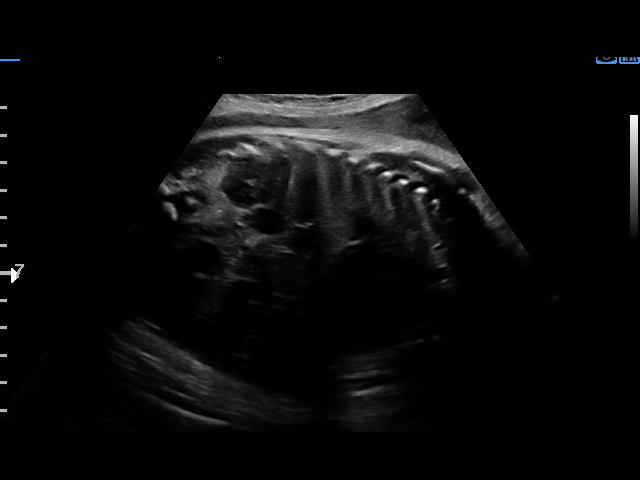
[im 17/30]
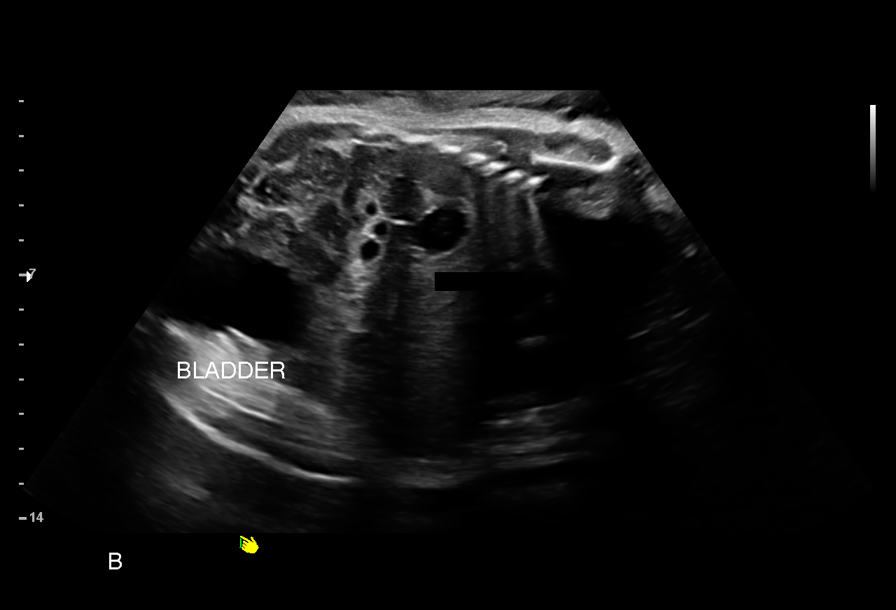
[im 19/30]
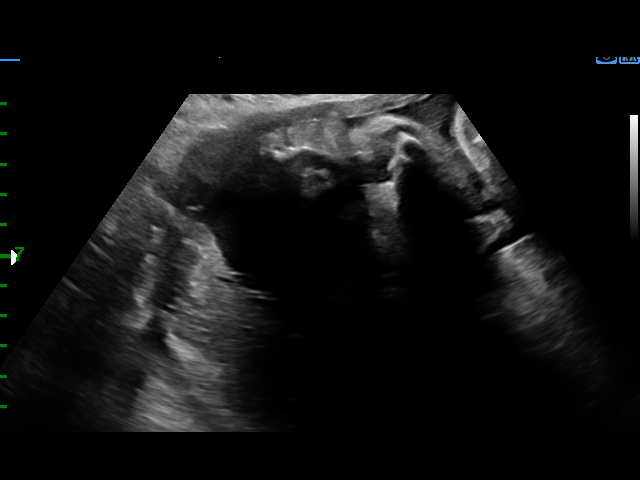
[im 21/30]
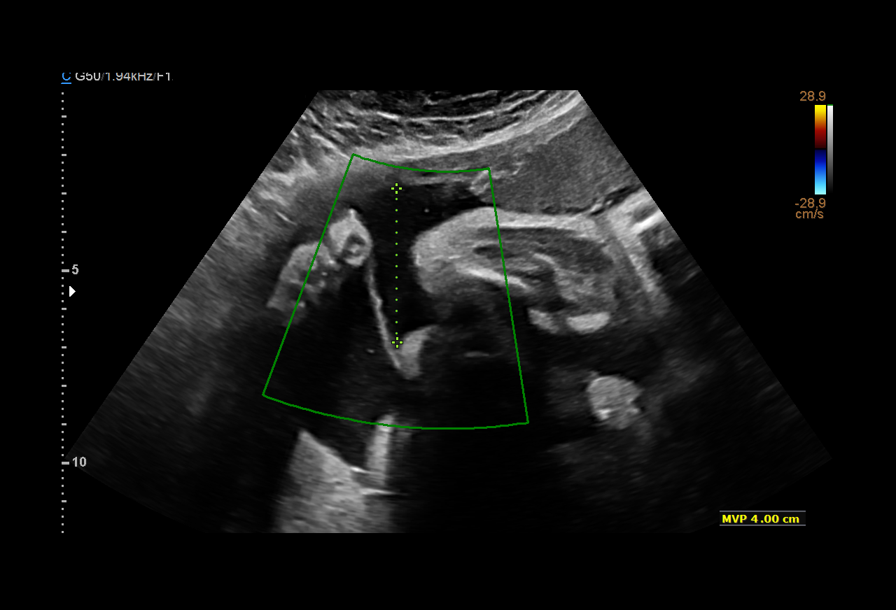
[im 24/30]
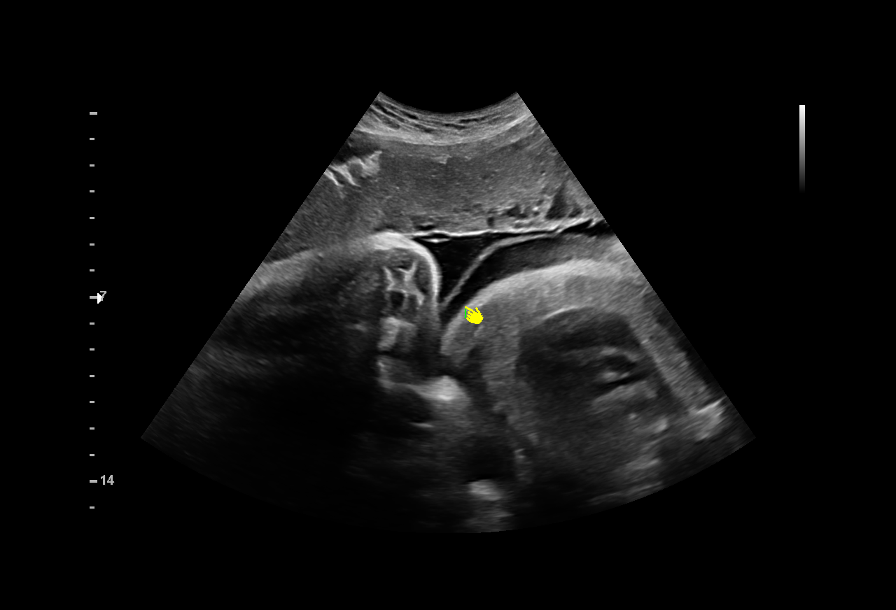
[im 26/30]
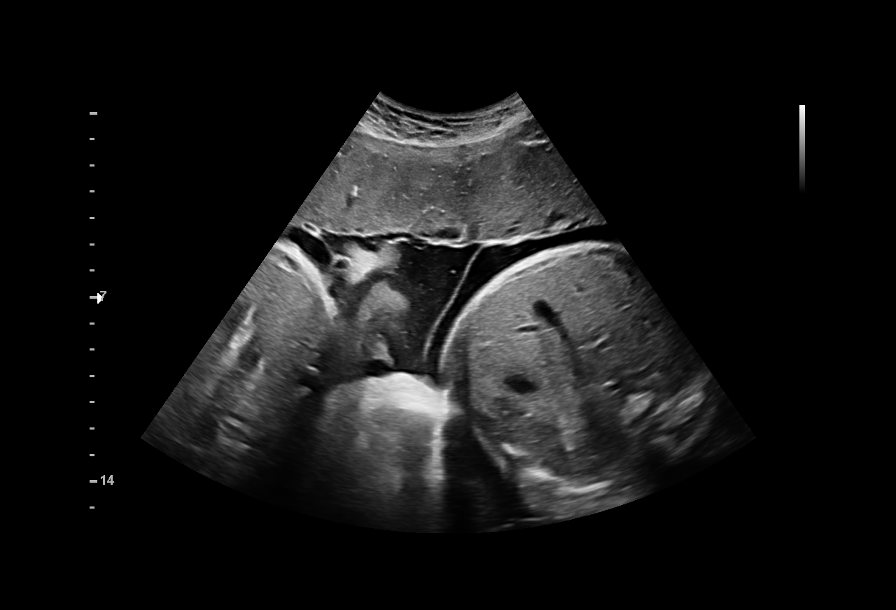
[im 28/30]
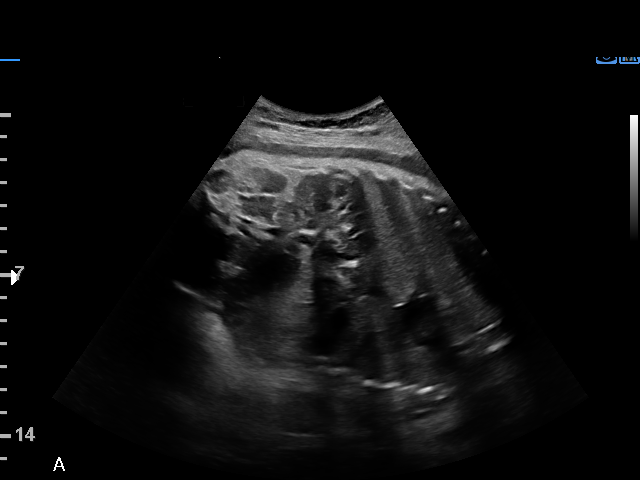

[12 of 28 positions shown; findings below may reference images not displayed]

Road [HOSPITAL]

GESTATION

1  JACQLIN MARIA TELEBAR              [id number]      [id number]     [id number]
2  JACQLIN MARIA TELEBAR              [id number]      [id number]     [id number]
Indications

37 weeks gestation of pregnancy
Twin pregnancy, di/di, third trimester
OB History

Blood Type:            Height:  5'5"   Weight (lb):  181       BMI:
Gravidity:    5         Term:   4        Prem:   0        SAB:   0
TOP:          0       Ectopic:  0        Living: 4
Fetal Evaluation (Fetus A)

Num Of Fetuses:     2
Fetal Heart         159
Rate(bpm):
Cardiac Activity:   Observed
Fetal Lie:          Maternal left side
Presentation:       Cephalic

Amniotic Fluid
AFI FV:      Subjectively within normal limits

Largest Pocket(cm)
6.01
Biophysical Evaluation (Fetus A)

Amniotic F.V:   Within normal limits       F. Tone:        Observed
F. Movement:    Observed                   Score:          [DATE]
F. Breathing:   Observed
Gestational Age (Fetus A)

LMP:           37w 4d        Date:  12/24/16                 EDD:   09/30/17
Best:          37w 4d     Det. By:  LMP  (12/24/16)          EDD:   09/30/17

Fetal Evaluation (Fetus B)

Num Of Fetuses:     2
Fetal Heart         149
Rate(bpm):
Cardiac Activity:   Observed
Fetal Lie:          Maternal right side
Presentation:       Cephalic

Amniotic Fluid
AFI FV:      Subjectively within normal limits

Largest Pocket(cm)
4.22
Biophysical Evaluation (Fetus B)

Amniotic F.V:   Within normal limits       F. Tone:        Observed
F. Movement:    Observed                   Score:          [DATE]
F. Breathing:   Observed
Gestational Age (Fetus B)

LMP:           37w 4d        Date:  12/24/16                 EDD:   09/30/17
Best:          37w 4d     Det. By:  LMP  (12/24/16)          EDD:   09/30/17
Impression

Dichorionic/diamniotic twin pregnancy at 37+4 weeks
Cephalaic/cephalic presentation
Normal amniotic fluid volume x 2
BPP [DATE] x 2
Recommendations

Induction in 3 days

## 2019-11-20 ENCOUNTER — Other Ambulatory Visit: Payer: Self-pay

## 2019-11-20 ENCOUNTER — Encounter: Payer: Self-pay | Admitting: Obstetrics and Gynecology

## 2019-11-20 ENCOUNTER — Other Ambulatory Visit (HOSPITAL_COMMUNITY)
Admission: RE | Admit: 2019-11-20 | Discharge: 2019-11-20 | Disposition: A | Discharge status: Home / Self Care | Payer: Medicaid Other | Source: Ambulatory Visit | Attending: Obstetrics and Gynecology | Admitting: Obstetrics and Gynecology

## 2019-11-20 ENCOUNTER — Ambulatory Visit (INDEPENDENT_AMBULATORY_CARE_PROVIDER_SITE_OTHER): Payer: Medicaid Other | Admitting: Obstetrics and Gynecology

## 2019-11-20 VITALS — BP 124/85 | HR 76 | Ht 65.0 in | Wt 183.0 lb

## 2019-11-20 DIAGNOSIS — Z01419 Encounter for gynecological examination (general) (routine) without abnormal findings: Secondary | ICD-10-CM | POA: Insufficient documentation

## 2019-11-20 DIAGNOSIS — Z1151 Encounter for screening for human papillomavirus (HPV): Secondary | ICD-10-CM | POA: Insufficient documentation

## 2019-11-20 DIAGNOSIS — Z0000 Encounter for general adult medical examination without abnormal findings: Secondary | ICD-10-CM | POA: Diagnosis not present

## 2019-11-20 DIAGNOSIS — Z124 Encounter for screening for malignant neoplasm of cervix: Secondary | ICD-10-CM

## 2019-11-20 NOTE — Progress Notes (Signed)
Patient presents for Annual Exam.  LMP: 10/24/19 cycles last 4 days with moderate flow.   Last Pap:  05/07/2017 ASCUS   STD Screening:  Declines   Contraception: None   Family Hx of Breast Cancer: PGM   CC: NONE   *Patient consents to student in exam room to talk to her but does not want female in room during exam

## 2019-11-20 NOTE — Progress Notes (Signed)
GYNECOLOGY ANNUAL PREVENTATIVE CARE ENCOUNTER NOTE  History:     Andrea Alexander is a 39 y.o. [id number] female here for a routine annual gynecologic exam.  Current complaints: None.   Denies abnormal vaginal bleeding, discharge, pelvic pain, problems with intercourse or other gynecologic concerns.    Concerned about abnormal hair growth on face. Previous MD mentioned PCOS however it was never mentioned it ago. She does not have abnormal periods. She does tend to carry more weight in her abdomen.    Gynecologic History Contraception: condoms Last Pap: 2018. Results were: abnormal with negative HPV Last mammogram: NA  Obstetric History OB History  Gravida Para Term Preterm AB Living  5 5 5     6   SAB TAB Ectopic Multiple Live Births        1 6    # Outcome Date GA Lbr Len/2nd Weight Sex Delivery Anes PTL Lv  5A Term 09/15/17 [redacted]w[redacted]d 11:04 / 00:07 6 lb 4.4 oz (2.845 kg) M Vag-Spont EPI  LIV  5B Term 09/15/17 [redacted]w[redacted]d 11:04 / 00:18 5 lb 14.4 oz (2.675 kg) F Vag-Spont EPI  LIV  4 Term 04/05/14 [redacted]w[redacted]d   F Vag-Spont   LIV  3 Term 04/09/11 [redacted]w[redacted]d   M Vag-Spont   LIV  2 Term 02/28/07 [redacted]w[redacted]d   F Vag-Spont   LIV  1 Term 04/24/05 [redacted]w[redacted]d   M Vag-Spont   LIV    Past Medical History:  Diagnosis Date  . Medical history non-contributory   . Vaginal Pap smear, abnormal    with this preg, plan for rpt after delivery    Past Surgical History:  Procedure Laterality Date  . NO PAST SURGERIES    . WISDOM TOOTH EXTRACTION      Current Outpatient Medications on File Prior to Visit  Medication Sig Dispense Refill  . ibuprofen (ADVIL,MOTRIN) 600 MG tablet Take 1 tablet (600 mg total) every 6 (six) hours by mouth. (Patient not taking: Reported on 10/16/2017) 30 tablet 0  . Prenat-FeCbn-FeAspGl-FA-Omega (OB COMPLETE PETITE) 35-5-1-200 MG CAPS Take 1 tablet by mouth daily. (Patient not taking: Reported on 05/29/2018) 30 capsule 12   No current facility-administered medications on file prior to visit.     No Known Allergies  Social History:  reports that she has never smoked. She has never used smokeless tobacco. She reports that she does not drink alcohol or use drugs.  Family History  Problem Relation Age of Onset  . Diabetes Father   . Diabetes Maternal Grandfather   . Diabetes Paternal Grandmother   . Diabetes Paternal Grandfather     The following portions of the patient's history were reviewed and updated as appropriate: allergies, current medications, past family history, past medical history, past social history, past surgical history and problem list.  Review of Systems Pertinent items noted in HPI and remainder of comprehensive ROS otherwise negative.  Physical Exam:  BP 124/85   Pulse 76   Ht 5\' 5"  (1.651 m)   Wt 183 lb (83 kg)   BMI 30.45 kg/m  CONSTITUTIONAL: Well-developed, well-nourished female in no acute distress.  HENT:  Normocephalic, atraumatic, External right and left ear normal. Oropharynx is clear and moist EYES: Conjunctivae and EOM are normal. Pupils are equal, round, and reactive to light. No scleral icterus.  NECK: Normal range of motion, supple, no masses.  Normal thyroid.  SKIN: Skin is warm and dry. No rash noted. Not diaphoretic. No erythema. No pallor. MUSCULOSKELETAL: Normal range of motion. No tenderness.  No cyanosis, clubbing, or edema.  2+ distal pulses. NEUROLOGIC: Alert and oriented to person, place, and time. Normal reflexes, muscle tone coordination.  PSYCHIATRIC: Normal mood and affect. Normal behavior. Normal judgment and thought content. CARDIOVASCULAR: Normal heart rate noted, regular rhythm RESPIRATORY: Clear to auscultation bilaterally. Effort and breath sounds normal, no problems with respiration noted. BREASTS: Symmetric in size. No masses, tenderness, skin changes, nipple drainage, or lymphadenopathy bilaterally. ABDOMEN: Soft, no distention noted.  No tenderness, rebound or guarding.  PELVIC: Normal appearing external genitalia  and urethral meatus; normal appearing vaginal mucosa and cervix.  No abnormal discharge noted.  Pap smear obtained.  Normal uterine size, no other palpable masses, no uterine or adnexal tenderness.   Assessment and Plan:   1. Screening for cervical cancer  - Cytology - PAP( )  2. Encounter for annual routine gynecological examination  - TSH - HgB A1c  Will follow up results of pap smear and manage accordingly. Routine preventative health maintenance measures emphasized. Please refer to After Visit Summary for other counseling recommendations.   Sevin Farone, Artist Pais, [place] for [employer], [place]

## 2019-11-21 LAB — HEMOGLOBIN A1C
Est. average glucose Bld gHb Est-mCnc: 103 mg/dL
Hgb A1c MFr Bld: 5.2 % (ref 4.8–5.6)

## 2019-11-21 LAB — TSH: TSH: 2.41 u[IU]/mL (ref 0.450–4.500)

## 2019-11-23 LAB — CYTOLOGY - PAP
Comment: NEGATIVE
Diagnosis: NEGATIVE
High risk HPV: NEGATIVE
